# Patient Record
Sex: Male | Born: 1950 | Race: White | Hispanic: No | Marital: Married | Smoking: Former smoker
Health system: Southern US, Community
[De-identification: ages and names within clinical notes are randomized; demographics above are authoritative.]

## PROBLEM LIST (undated history)

## (undated) DIAGNOSIS — E119 Type 2 diabetes mellitus without complications: Secondary | ICD-10-CM

## (undated) DIAGNOSIS — I2510 Atherosclerotic heart disease of native coronary artery without angina pectoris: Secondary | ICD-10-CM

## (undated) DIAGNOSIS — R001 Bradycardia, unspecified: Secondary | ICD-10-CM

## (undated) DIAGNOSIS — I219 Acute myocardial infarction, unspecified: Secondary | ICD-10-CM

## (undated) HISTORY — PX: COLONOSCOPY: SHX5424

## (undated) HISTORY — PX: CARDIAC SURGERY: SHX584

---
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
## 1998-10-02 ENCOUNTER — Emergency Department (HOSPITAL_COMMUNITY): Admission: EM | Admit: 1998-10-02 | Discharge: 1998-10-02 | Payer: Self-pay | Admitting: Emergency Medicine

## 1998-10-15 ENCOUNTER — Encounter: Admission: RE | Admit: 1998-10-15 | Discharge: 1998-10-15 | Payer: Self-pay | Admitting: *Deleted

## 2015-12-12 DIAGNOSIS — R079 Chest pain, unspecified: Secondary | ICD-10-CM | POA: Insufficient documentation

## 2015-12-12 DIAGNOSIS — E119 Type 2 diabetes mellitus without complications: Secondary | ICD-10-CM

## 2015-12-12 DIAGNOSIS — Z720 Tobacco use: Secondary | ICD-10-CM | POA: Insufficient documentation

## 2015-12-12 DIAGNOSIS — I252 Old myocardial infarction: Secondary | ICD-10-CM

## 2015-12-12 DIAGNOSIS — Z789 Other specified health status: Secondary | ICD-10-CM

## 2015-12-12 HISTORY — DX: Tobacco use: Z72.0

## 2015-12-12 HISTORY — DX: Chest pain, unspecified: R07.9

## 2015-12-12 HISTORY — DX: Old myocardial infarction: I25.2

## 2017-03-08 DIAGNOSIS — I2510 Atherosclerotic heart disease of native coronary artery without angina pectoris: Secondary | ICD-10-CM | POA: Diagnosis not present

## 2017-03-08 DIAGNOSIS — J069 Acute upper respiratory infection, unspecified: Secondary | ICD-10-CM | POA: Diagnosis not present

## 2017-03-08 DIAGNOSIS — E1165 Type 2 diabetes mellitus with hyperglycemia: Secondary | ICD-10-CM | POA: Diagnosis not present

## 2017-03-08 DIAGNOSIS — R05 Cough: Secondary | ICD-10-CM | POA: Diagnosis not present

## 2017-03-08 DIAGNOSIS — D511 Vitamin B12 deficiency anemia due to selective vitamin B12 malabsorption with proteinuria: Secondary | ICD-10-CM | POA: Diagnosis not present

## 2017-03-14 DIAGNOSIS — E113512 Type 2 diabetes mellitus with proliferative diabetic retinopathy with macular edema, left eye: Secondary | ICD-10-CM | POA: Diagnosis not present

## 2017-03-14 DIAGNOSIS — H35372 Puckering of macula, left eye: Secondary | ICD-10-CM | POA: Diagnosis not present

## 2017-03-14 DIAGNOSIS — H43822 Vitreomacular adhesion, left eye: Secondary | ICD-10-CM | POA: Diagnosis not present

## 2017-03-14 DIAGNOSIS — E113591 Type 2 diabetes mellitus with proliferative diabetic retinopathy without macular edema, right eye: Secondary | ICD-10-CM | POA: Diagnosis not present

## 2017-03-19 DIAGNOSIS — R140 Abdominal distension (gaseous): Secondary | ICD-10-CM | POA: Diagnosis not present

## 2017-03-19 DIAGNOSIS — I440 Atrioventricular block, first degree: Secondary | ICD-10-CM | POA: Diagnosis not present

## 2017-03-19 DIAGNOSIS — E785 Hyperlipidemia, unspecified: Secondary | ICD-10-CM | POA: Diagnosis not present

## 2017-03-19 DIAGNOSIS — E119 Type 2 diabetes mellitus without complications: Secondary | ICD-10-CM | POA: Diagnosis not present

## 2017-03-19 DIAGNOSIS — F17220 Nicotine dependence, chewing tobacco, uncomplicated: Secondary | ICD-10-CM | POA: Diagnosis not present

## 2017-03-19 DIAGNOSIS — I252 Old myocardial infarction: Secondary | ICD-10-CM | POA: Diagnosis not present

## 2017-03-19 DIAGNOSIS — K5900 Constipation, unspecified: Secondary | ICD-10-CM | POA: Diagnosis not present

## 2017-03-19 DIAGNOSIS — R109 Unspecified abdominal pain: Secondary | ICD-10-CM | POA: Diagnosis not present

## 2017-03-19 DIAGNOSIS — R1010 Upper abdominal pain, unspecified: Secondary | ICD-10-CM | POA: Diagnosis not present

## 2017-04-18 DIAGNOSIS — D511 Vitamin B12 deficiency anemia due to selective vitamin B12 malabsorption with proteinuria: Secondary | ICD-10-CM | POA: Diagnosis not present

## 2017-04-18 DIAGNOSIS — I2510 Atherosclerotic heart disease of native coronary artery without angina pectoris: Secondary | ICD-10-CM | POA: Diagnosis not present

## 2017-04-18 DIAGNOSIS — E1165 Type 2 diabetes mellitus with hyperglycemia: Secondary | ICD-10-CM | POA: Diagnosis not present

## 2017-04-18 DIAGNOSIS — E782 Mixed hyperlipidemia: Secondary | ICD-10-CM | POA: Diagnosis not present

## 2017-05-21 DIAGNOSIS — H35373 Puckering of macula, bilateral: Secondary | ICD-10-CM | POA: Diagnosis not present

## 2017-05-21 DIAGNOSIS — H43822 Vitreomacular adhesion, left eye: Secondary | ICD-10-CM | POA: Diagnosis not present

## 2017-05-21 DIAGNOSIS — H3582 Retinal ischemia: Secondary | ICD-10-CM | POA: Diagnosis not present

## 2017-05-21 DIAGNOSIS — E113513 Type 2 diabetes mellitus with proliferative diabetic retinopathy with macular edema, bilateral: Secondary | ICD-10-CM | POA: Diagnosis not present

## 2017-06-15 DIAGNOSIS — E1165 Type 2 diabetes mellitus with hyperglycemia: Secondary | ICD-10-CM | POA: Diagnosis not present

## 2017-06-15 DIAGNOSIS — E559 Vitamin D deficiency, unspecified: Secondary | ICD-10-CM | POA: Diagnosis not present

## 2017-06-15 DIAGNOSIS — E291 Testicular hypofunction: Secondary | ICD-10-CM | POA: Diagnosis not present

## 2017-06-15 DIAGNOSIS — I10 Essential (primary) hypertension: Secondary | ICD-10-CM | POA: Diagnosis not present

## 2017-06-15 DIAGNOSIS — D518 Other vitamin B12 deficiency anemias: Secondary | ICD-10-CM | POA: Diagnosis not present

## 2017-06-15 DIAGNOSIS — J309 Allergic rhinitis, unspecified: Secondary | ICD-10-CM | POA: Diagnosis not present

## 2017-06-15 DIAGNOSIS — E119 Type 2 diabetes mellitus without complications: Secondary | ICD-10-CM | POA: Diagnosis not present

## 2017-06-15 DIAGNOSIS — E785 Hyperlipidemia, unspecified: Secondary | ICD-10-CM | POA: Diagnosis not present

## 2017-06-15 DIAGNOSIS — E782 Mixed hyperlipidemia: Secondary | ICD-10-CM | POA: Diagnosis not present

## 2017-06-15 DIAGNOSIS — E038 Other specified hypothyroidism: Secondary | ICD-10-CM | POA: Diagnosis not present

## 2017-06-24 DIAGNOSIS — Z87891 Personal history of nicotine dependence: Secondary | ICD-10-CM | POA: Diagnosis not present

## 2017-06-24 DIAGNOSIS — Z794 Long term (current) use of insulin: Secondary | ICD-10-CM | POA: Diagnosis not present

## 2017-06-24 DIAGNOSIS — R42 Dizziness and giddiness: Secondary | ICD-10-CM | POA: Diagnosis not present

## 2017-06-24 DIAGNOSIS — I252 Old myocardial infarction: Secondary | ICD-10-CM | POA: Diagnosis not present

## 2017-06-24 DIAGNOSIS — R55 Syncope and collapse: Secondary | ICD-10-CM | POA: Diagnosis not present

## 2017-06-24 DIAGNOSIS — R202 Paresthesia of skin: Secondary | ICD-10-CM | POA: Diagnosis not present

## 2017-06-24 DIAGNOSIS — R208 Other disturbances of skin sensation: Secondary | ICD-10-CM | POA: Diagnosis not present

## 2017-06-24 DIAGNOSIS — I10 Essential (primary) hypertension: Secondary | ICD-10-CM | POA: Diagnosis not present

## 2017-06-24 DIAGNOSIS — Z955 Presence of coronary angioplasty implant and graft: Secondary | ICD-10-CM | POA: Diagnosis not present

## 2017-06-24 DIAGNOSIS — Z7902 Long term (current) use of antithrombotics/antiplatelets: Secondary | ICD-10-CM | POA: Diagnosis not present

## 2017-06-24 DIAGNOSIS — R05 Cough: Secondary | ICD-10-CM | POA: Diagnosis not present

## 2017-06-24 DIAGNOSIS — E785 Hyperlipidemia, unspecified: Secondary | ICD-10-CM | POA: Diagnosis not present

## 2017-06-24 DIAGNOSIS — R232 Flushing: Secondary | ICD-10-CM | POA: Diagnosis not present

## 2017-06-24 DIAGNOSIS — E119 Type 2 diabetes mellitus without complications: Secondary | ICD-10-CM | POA: Diagnosis not present

## 2017-06-26 DIAGNOSIS — J018 Other acute sinusitis: Secondary | ICD-10-CM | POA: Diagnosis not present

## 2017-06-26 DIAGNOSIS — R05 Cough: Secondary | ICD-10-CM | POA: Diagnosis not present

## 2017-06-26 DIAGNOSIS — E1165 Type 2 diabetes mellitus with hyperglycemia: Secondary | ICD-10-CM | POA: Diagnosis not present

## 2017-06-28 DIAGNOSIS — E119 Type 2 diabetes mellitus without complications: Secondary | ICD-10-CM | POA: Diagnosis not present

## 2017-06-28 DIAGNOSIS — Z7982 Long term (current) use of aspirin: Secondary | ICD-10-CM | POA: Diagnosis not present

## 2017-06-28 DIAGNOSIS — E785 Hyperlipidemia, unspecified: Secondary | ICD-10-CM | POA: Diagnosis not present

## 2017-06-28 DIAGNOSIS — I252 Old myocardial infarction: Secondary | ICD-10-CM | POA: Diagnosis not present

## 2017-06-28 DIAGNOSIS — Z7902 Long term (current) use of antithrombotics/antiplatelets: Secondary | ICD-10-CM | POA: Diagnosis not present

## 2017-06-28 DIAGNOSIS — R109 Unspecified abdominal pain: Secondary | ICD-10-CM | POA: Diagnosis not present

## 2017-06-28 DIAGNOSIS — N281 Cyst of kidney, acquired: Secondary | ICD-10-CM | POA: Diagnosis not present

## 2017-06-28 DIAGNOSIS — R1013 Epigastric pain: Secondary | ICD-10-CM | POA: Diagnosis not present

## 2017-06-28 DIAGNOSIS — Z794 Long term (current) use of insulin: Secondary | ICD-10-CM | POA: Diagnosis not present

## 2017-06-29 DIAGNOSIS — E785 Hyperlipidemia, unspecified: Secondary | ICD-10-CM | POA: Diagnosis not present

## 2017-06-29 DIAGNOSIS — E559 Vitamin D deficiency, unspecified: Secondary | ICD-10-CM | POA: Diagnosis not present

## 2017-06-29 DIAGNOSIS — E1165 Type 2 diabetes mellitus with hyperglycemia: Secondary | ICD-10-CM | POA: Diagnosis not present

## 2017-06-29 DIAGNOSIS — E291 Testicular hypofunction: Secondary | ICD-10-CM | POA: Diagnosis not present

## 2017-07-27 DIAGNOSIS — R0602 Shortness of breath: Secondary | ICD-10-CM | POA: Diagnosis not present

## 2017-07-27 DIAGNOSIS — F419 Anxiety disorder, unspecified: Secondary | ICD-10-CM | POA: Diagnosis not present

## 2017-07-27 DIAGNOSIS — Z794 Long term (current) use of insulin: Secondary | ICD-10-CM | POA: Diagnosis not present

## 2017-07-27 DIAGNOSIS — E1165 Type 2 diabetes mellitus with hyperglycemia: Secondary | ICD-10-CM | POA: Diagnosis not present

## 2017-07-27 DIAGNOSIS — F17220 Nicotine dependence, chewing tobacco, uncomplicated: Secondary | ICD-10-CM | POA: Diagnosis not present

## 2017-07-27 DIAGNOSIS — Z7982 Long term (current) use of aspirin: Secondary | ICD-10-CM | POA: Diagnosis not present

## 2017-07-27 DIAGNOSIS — D511 Vitamin B12 deficiency anemia due to selective vitamin B12 malabsorption with proteinuria: Secondary | ICD-10-CM | POA: Diagnosis not present

## 2017-07-27 DIAGNOSIS — R110 Nausea: Secondary | ICD-10-CM | POA: Diagnosis not present

## 2017-07-27 DIAGNOSIS — Z955 Presence of coronary angioplasty implant and graft: Secondary | ICD-10-CM | POA: Diagnosis not present

## 2017-07-27 DIAGNOSIS — E119 Type 2 diabetes mellitus without complications: Secondary | ICD-10-CM | POA: Diagnosis not present

## 2017-07-27 DIAGNOSIS — F410 Panic disorder [episodic paroxysmal anxiety] without agoraphobia: Secondary | ICD-10-CM | POA: Diagnosis not present

## 2017-07-27 DIAGNOSIS — J069 Acute upper respiratory infection, unspecified: Secondary | ICD-10-CM | POA: Diagnosis not present

## 2017-07-27 DIAGNOSIS — Z7902 Long term (current) use of antithrombotics/antiplatelets: Secondary | ICD-10-CM | POA: Diagnosis not present

## 2017-07-27 DIAGNOSIS — J018 Other acute sinusitis: Secondary | ICD-10-CM | POA: Diagnosis not present

## 2017-07-27 DIAGNOSIS — I2510 Atherosclerotic heart disease of native coronary artery without angina pectoris: Secondary | ICD-10-CM | POA: Diagnosis not present

## 2017-07-27 DIAGNOSIS — R531 Weakness: Secondary | ICD-10-CM | POA: Diagnosis not present

## 2017-07-27 DIAGNOSIS — I252 Old myocardial infarction: Secondary | ICD-10-CM | POA: Diagnosis not present

## 2017-07-27 DIAGNOSIS — R0789 Other chest pain: Secondary | ICD-10-CM | POA: Diagnosis not present

## 2017-07-27 DIAGNOSIS — R079 Chest pain, unspecified: Secondary | ICD-10-CM | POA: Diagnosis not present

## 2017-07-27 DIAGNOSIS — E785 Hyperlipidemia, unspecified: Secondary | ICD-10-CM | POA: Diagnosis not present

## 2017-07-27 DIAGNOSIS — D518 Other vitamin B12 deficiency anemias: Secondary | ICD-10-CM | POA: Diagnosis not present

## 2017-07-28 DIAGNOSIS — F17220 Nicotine dependence, chewing tobacco, uncomplicated: Secondary | ICD-10-CM | POA: Diagnosis not present

## 2017-07-28 DIAGNOSIS — E119 Type 2 diabetes mellitus without complications: Secondary | ICD-10-CM | POA: Diagnosis not present

## 2017-07-28 DIAGNOSIS — I252 Old myocardial infarction: Secondary | ICD-10-CM | POA: Diagnosis not present

## 2017-07-28 DIAGNOSIS — R079 Chest pain, unspecified: Secondary | ICD-10-CM | POA: Diagnosis not present

## 2017-07-29 ENCOUNTER — Encounter (HOSPITAL_COMMUNITY): Payer: Self-pay | Admitting: Emergency Medicine

## 2017-07-29 ENCOUNTER — Emergency Department (HOSPITAL_COMMUNITY): Payer: PPO

## 2017-07-29 ENCOUNTER — Other Ambulatory Visit: Payer: Self-pay

## 2017-07-29 ENCOUNTER — Emergency Department (HOSPITAL_COMMUNITY)
Admission: EM | Admit: 2017-07-29 | Discharge: 2017-07-29 | Disposition: A | Discharge status: Home / Self Care | Payer: PPO | Attending: Physician Assistant | Admitting: Physician Assistant

## 2017-07-29 DIAGNOSIS — Z87891 Personal history of nicotine dependence: Secondary | ICD-10-CM | POA: Diagnosis not present

## 2017-07-29 DIAGNOSIS — Z7902 Long term (current) use of antithrombotics/antiplatelets: Secondary | ICD-10-CM | POA: Insufficient documentation

## 2017-07-29 DIAGNOSIS — Z7982 Long term (current) use of aspirin: Secondary | ICD-10-CM | POA: Diagnosis not present

## 2017-07-29 DIAGNOSIS — R0789 Other chest pain: Secondary | ICD-10-CM | POA: Insufficient documentation

## 2017-07-29 DIAGNOSIS — I2510 Atherosclerotic heart disease of native coronary artery without angina pectoris: Secondary | ICD-10-CM | POA: Insufficient documentation

## 2017-07-29 DIAGNOSIS — Z794 Long term (current) use of insulin: Secondary | ICD-10-CM | POA: Insufficient documentation

## 2017-07-29 DIAGNOSIS — I252 Old myocardial infarction: Secondary | ICD-10-CM | POA: Insufficient documentation

## 2017-07-29 DIAGNOSIS — R9431 Abnormal electrocardiogram [ECG] [EKG]: Secondary | ICD-10-CM | POA: Diagnosis not present

## 2017-07-29 DIAGNOSIS — R079 Chest pain, unspecified: Secondary | ICD-10-CM | POA: Diagnosis not present

## 2017-07-29 DIAGNOSIS — Z79899 Other long term (current) drug therapy: Secondary | ICD-10-CM | POA: Insufficient documentation

## 2017-07-29 HISTORY — DX: Atherosclerotic heart disease of native coronary artery without angina pectoris: I25.10

## 2017-07-29 HISTORY — DX: Acute myocardial infarction, unspecified: I21.9

## 2017-07-29 LAB — BASIC METABOLIC PANEL
ANION GAP: 9 (ref 5–15)
BUN: 12 mg/dL (ref 6–20)
CALCIUM: 9 mg/dL (ref 8.9–10.3)
CO2: 23 mmol/L (ref 22–32)
Chloride: 103 mmol/L (ref 101–111)
Creatinine, Ser: 0.94 mg/dL (ref 0.61–1.24)
GFR calc Af Amer: >60 mL/min (ref >60)
GFR calc non Af Amer: >60 mL/min (ref >60)
GLUCOSE: 241 mg/dL — AB (ref 65–99)
POTASSIUM: 4.1 mmol/L (ref 3.5–5.1)
Sodium: 135 mmol/L (ref 135–145)

## 2017-07-29 LAB — I-STAT TROPONIN, ED
Troponin i, poc: 0 ng/mL (ref 0.00–0.08)
Troponin i, poc: 0.01 ng/mL (ref 0.00–0.08)

## 2017-07-29 LAB — CBC
HEMATOCRIT: 44.2 % (ref 39.0–52.0)
HEMOGLOBIN: 14.6 g/dL (ref 13.0–17.0)
MCH: 30.5 pg (ref 26.0–34.0)
MCHC: 33 g/dL (ref 30.0–36.0)
MCV: 92.3 fL (ref 78.0–100.0)
Platelets: 251 10*3/uL (ref 150–400)
RBC: 4.79 MIL/uL (ref 4.22–5.81)
RDW: 12.5 % (ref 11.5–15.5)
WBC: 8 10*3/uL (ref 4.0–10.5)

## 2017-07-29 MED ORDER — ATORVASTATIN CALCIUM 40 MG PO TABS
40.00 | ORAL_TABLET | ORAL | Status: DC
Start: 2017-07-29 — End: 2017-07-29

## 2017-07-29 MED ORDER — INSULIN LISPRO 100 UNIT/ML ~~LOC~~ SOLN
0.00 | SUBCUTANEOUS | Status: DC
Start: 2017-07-28 — End: 2017-07-29

## 2017-07-29 MED ORDER — INSULIN GLARGINE 100 UNIT/ML SOLOSTAR PEN
36.00 | PEN_INJECTOR | SUBCUTANEOUS | Status: DC
Start: 2017-07-28 — End: 2017-07-29

## 2017-07-29 MED ORDER — CLOPIDOGREL BISULFATE 75 MG PO TABS
75.00 | ORAL_TABLET | ORAL | Status: DC
Start: 2017-07-29 — End: 2017-07-29

## 2017-07-29 MED ORDER — ASPIRIN EC 81 MG PO TBEC
81.00 | DELAYED_RELEASE_TABLET | ORAL | Status: DC
Start: 2017-07-29 — End: 2017-07-29

## 2017-07-29 MED ORDER — DEXTROSE 50 % IV SOLN
12.50 | INTRAVENOUS | Status: DC
Start: ? — End: 2017-07-29

## 2017-07-29 NOTE — Discharge Instructions (Addendum)
We are unsure what is causing your symptoms.  Want you to follow-up with your primary care in the next 2 days.  Please keep a log of your symptoms and atypical feelings to record them.  In particular pay attention to any things you may have eaten prior and the time of day it happens.  Please also follow-up with your cardiology.  You may need another stress test given that has been almost a year since her last one.

## 2017-07-29 NOTE — ED Triage Notes (Signed)
Pt c/o chest pain that radiates to the left arm, diaphoresis and feeling "swimmy headed". Similar episode Friday, seen at [facility] and started on buspar 10mg , and given a heart monitor to wear. Hx MI with stents.

## 2017-07-29 NOTE — ED Provider Notes (Addendum)
[place] EMERGENCY DEPARTMENT Provider Note   CSN: [id number] Arrival date & time: 07/29/17  1601     History   Chief Complaint Chief Complaint  Patient presents with  . Chest Pain    HPI Jay Hanson is a 67 y.o. male.  HPI   67 yo sp stent 5 years ago.  Ho htn, dm, hld. Over the last month he has had 4 discrete events with SOB, diaphoresis, CP nausea.  .  Patient was discharged 2 days after hosptiziation for these "attacks'.  With normal tele monitoring and negative serial trops. 1 of these events happened during the last hospitlization. Patient was discharged on heart monitor.    Patient had another "event" today while sitting in the recliner.    patient started on Busporone after last hosptilization. (started today)   Past Medical History:  Diagnosis Date  . Coronary artery disease   . MI (myocardial infarction) ([place])     There are no active problems to display for this patient.   Past Surgical History:  Procedure Laterality Date  . CARDIAC SURGERY          Home Medications    Prior to Admission medications   Medication Sig Start Date End Date Taking? Authorizing Provider  aspirin EC 81 MG tablet Take 81 mg by mouth daily.   Yes [provider]  busPIRone (BUSPAR) 10 MG tablet Take 10 mg by mouth 2 (two) times daily. 07/28/17  Yes [provider]  clopidogrel (PLAVIX) 75 MG tablet Take 37.5 mg by mouth daily.  06/15/17  Yes [provider]  dextromethorphan-guaiFENesin (MUCINEX DM) 30-600 MG 12hr tablet Take 1 tablet by mouth 2 (two) times daily.   Yes [provider]  fluticasone (FLONASE) 50 MCG/ACT nasal spray Place 1 spray into both nostrils daily as needed for allergies. 06/26/17  Yes [provider]  insulin aspart (NOVOLOG) 100 UNIT/ML injection Inject 1-4 Units into the skin 3 (three) times daily with meals. Per SS   Yes [provider]  insulin detemir (LEVEMIR) 100 UNIT/ML  injection Inject 36 Units into the skin 2 (two) times daily.   Yes [provider]  levofloxacin (LEVAQUIN) 750 MG tablet Take 750 mg by mouth daily. 07/27/17 08/07/17 Yes [provider]  metoCLOPramide (REGLAN) 10 MG tablet Take 10 mg by mouth daily. 06/29/17  Yes [provider]  sitaGLIPtin-metformin (JANUMET) 50-1000 MG tablet Take 1 tablet by mouth 2 (two) times daily.   Yes [provider]    Family History No family history on file.  Social History Social History   Tobacco Use  . Smoking status: Former Research scientist (life sciences)  . Smokeless tobacco: Never Used  Substance Use Topics  . Alcohol use: Never    Frequency: Never  . Drug use: Never     Allergies   Patient has no known allergies.   Review of Systems Review of Systems  Constitutional: Negative for activity change, fatigue and fever.  Respiratory: Negative for shortness of breath.   Cardiovascular: Positive for chest pain.  Gastrointestinal: Negative for abdominal pain.     Physical Exam Updated Vital Signs BP (!) 146/82   Pulse 64   Temp 98.3 F (36.8 C) (Oral)   Resp 20   SpO2 100%   Physical Exam  Constitutional: He is oriented to person, place, and time. He appears well-nourished.  HENT:  Head: Normocephalic.  Eyes: Pupils are equal, round, and reactive to light. Conjunctivae and EOM are normal.  Neck: Normal range of motion.  Cardiovascular: Normal rate, regular rhythm, intact distal pulses and normal pulses.  Pulmonary/Chest: Effort normal and breath sounds normal. No accessory muscle usage. No respiratory distress.  Neurological: He is oriented to person, place, and time.  Skin: Skin is warm and dry. He is not diaphoretic.  Psychiatric: He has a normal mood and affect. His behavior is normal.  Nursing note and vitals reviewed.    ED Treatments / Results  Labs (all labs ordered are listed, but only abnormal results are displayed) Labs Reviewed  BASIC METABOLIC PANEL -  Abnormal; Notable for the following components:      Result Value   Glucose, Bld 241 (*)    All other components within normal limits  CBC  I-STAT TROPONIN, ED    EKG EKG Interpretation  Date/Time:  Sunday Jul 29 2017 16:06:27 EDT Ventricular Rate:  79 PR Interval:  172 QRS Duration: 94 QT Interval:  380 QTC Calculation: 435 R Axis:   0 Text Interpretation:  Normal sinus rhythm Inferior infarct , age undetermined Abnormal ECG Normal sinus rhythm Confirmed by Levander Katzenstein ([id number]) on 07/29/2017 7:50:08 PM   Radiology Dg Chest 2 View  Result Date: 07/29/2017 CLINICAL DATA:  Chest pain EXAM: CHEST - 2 VIEW COMPARISON:  Jul 27, 2017 and June 11, 2016 FINDINGS: There is blunting of the left costophrenic angle, a chronic finding consistent with mild scarring. No edema or consolidation. Heart size and pulmonary vascularity are normal. No adenopathy. There is a cardiac monitor device overlying the anterior left hemithorax. There is degenerative change in the thoracic spine. IMPRESSION: Mild scarring lateral left base. No edema or consolidation. Heart size normal. No adenopathy evident. Electronically Signed   By: William  Woodruff III M.D.   On: 07/29/2017 17:05    Procedures Procedures (including critical care time)  Medications Ordered in ED Medications - No data to display   Initial Impression / Assessment and Plan / ED Course  I have reviewed the triage vital signs and the nursing notes.  Pertinent labs & imaging results that were available during my care of the patient were reviewed by me and considered in my medical decision making (see chart for details).     66  yo sp stent 5 years ago.  Over the last month he has had 4 discrete events with SOB, diaphoresis, CP nausea.  .  Patient was discharged 2 days after hosptiziation for these "attacks'.  With normal tele monitoring and negative serial trops. 1 of these events happened during the last hospitlization and nothing noted  on tele.  Patient was discharged on heart monitor.    patient started on Busporone after last hosptilization. (started today)   Patient had another "event" today while sitting in the recliner.    Of note patient has had normal stress test within the last year by his cards as well as carotids and echo that were normal.   9:08 PM According to care everywhere it looks like besides patient's recent hospitalization, discharged 24 hours ago.  He was also seen for abdominal pain on 25 April, chest pain on 21 April.  It appears he has had roughly 6 ER visits in the last 2 years for chest pain.  9:39 PM Patient's event today lasted less than 30 seconds.  Associated with heat, warmth feeling of slight shortness of breath and a twinge of chest pain.  It is really unclear what these events are.  They last less than 30 seconds, are not  typical for ACS.  Are not also typical for anxiety.  We will have him keep a close journal of the symptoms and when they occur.  We will have him bring it to his primary care  for follow-up.  Additionally patient aolready has a Holter monitor on.  Will encourage him to follow-up with his cardiology group and [place].    No risk factors for PE. No SOB. No tachycardia no hypoxia. No radiation to back. Discussed esophageal spasm.  There is is not much to be done by additional labs and admission at this time.  Patient is now status post 9 hours from this event.  We will do troponin at this time.  If negative I will idischarge patient home and have him return with any concerning symptoms.  Final Clinical Impressions(s) / ED Diagnoses   Final diagnoses:  None    ED Discharge Orders    None       Macarthur Critchley, MD 07/29/17 2149    Macarthur Critchley, MD 07/29/17 2217

## 2017-08-01 ENCOUNTER — Other Ambulatory Visit: Payer: Self-pay

## 2017-08-01 DIAGNOSIS — E291 Testicular hypofunction: Secondary | ICD-10-CM | POA: Diagnosis not present

## 2017-08-01 DIAGNOSIS — R300 Dysuria: Secondary | ICD-10-CM | POA: Diagnosis not present

## 2017-08-01 DIAGNOSIS — E559 Vitamin D deficiency, unspecified: Secondary | ICD-10-CM | POA: Diagnosis not present

## 2017-08-01 DIAGNOSIS — E785 Hyperlipidemia, unspecified: Secondary | ICD-10-CM | POA: Diagnosis not present

## 2017-08-01 DIAGNOSIS — I2510 Atherosclerotic heart disease of native coronary artery without angina pectoris: Secondary | ICD-10-CM | POA: Diagnosis not present

## 2017-08-01 DIAGNOSIS — R079 Chest pain, unspecified: Secondary | ICD-10-CM | POA: Diagnosis not present

## 2017-08-01 DIAGNOSIS — E1165 Type 2 diabetes mellitus with hyperglycemia: Secondary | ICD-10-CM | POA: Diagnosis not present

## 2017-08-01 NOTE — Patient Outreach (Signed)
[facility]) Care Management  08/01/2017  Jay Hanson Sep 24, 1950 [id number]   Referral Date: 08/01/17 Referral Source: HTA report Date of Admission: unknown Diagnosis: unknown Date of Discharge: 07/28/17 Facility:  [facility] attempt # 1 Telephone call to patient for transition of care. No answer.  Unable to leave a message.   Plan: RN CM will send letter and attempt again within 4 business days.     Jone Baseman, RN, MSN [facility] Care Management Care Management Coordinator Direct Line [phone] Toll Free: [phone]  Fax: [phone]

## 2017-08-02 ENCOUNTER — Other Ambulatory Visit: Payer: Self-pay

## 2017-08-02 NOTE — Patient Outreach (Signed)
[facility]) Care Management  08/02/2017  Jay Hanson April 24, 1950 [id number]   Referral Date: 08/01/17 Referral Source: HTA report Date of Admission: 07/27/17 Diagnosis: Chest pain Date of Discharge: 07/28/17 Facility: [place]: HTA  Outreach attempt #2 spoke with patient he is able to verify HIPAA.  Patient reports he is doing ok but just waiting on test results.  He states that he went to the hospital for chest pain and his heart things checked out normal.  He states that he was there less than 24 hours.  Asked patient about visit to [facility] ER.  He states he went for a second option and they found nothing with his heart as well.  Patient states that he saw his primary care physician on yesterday and lots of test were done including an echocardiogram.  He states they are thinking it is his hormones and waiting on results. He states he has episodes when he feels weak and nervous.  Patient reports that he has his medication and is able to afford medications.  He offers no concerns.    Discussed [facility] care management services with patient. He declines services but appreciative of the call.    Plan: RN CM will send letter and close case.     Jone Baseman, RN, MSN [facility] Care Management Care Management Coordinator Direct Line [phone] Toll Free: [phone]  Fax: [phone]

## 2017-08-08 DIAGNOSIS — E1165 Type 2 diabetes mellitus with hyperglycemia: Secondary | ICD-10-CM | POA: Diagnosis not present

## 2017-08-08 DIAGNOSIS — E559 Vitamin D deficiency, unspecified: Secondary | ICD-10-CM | POA: Diagnosis not present

## 2017-08-08 DIAGNOSIS — E785 Hyperlipidemia, unspecified: Secondary | ICD-10-CM | POA: Diagnosis not present

## 2017-08-08 DIAGNOSIS — E291 Testicular hypofunction: Secondary | ICD-10-CM | POA: Diagnosis not present

## 2017-08-09 DIAGNOSIS — E291 Testicular hypofunction: Secondary | ICD-10-CM | POA: Diagnosis not present

## 2017-08-13 DIAGNOSIS — H3582 Retinal ischemia: Secondary | ICD-10-CM | POA: Diagnosis not present

## 2017-08-13 DIAGNOSIS — H35373 Puckering of macula, bilateral: Secondary | ICD-10-CM | POA: Diagnosis not present

## 2017-08-13 DIAGNOSIS — H43822 Vitreomacular adhesion, left eye: Secondary | ICD-10-CM | POA: Diagnosis not present

## 2017-08-13 DIAGNOSIS — E113513 Type 2 diabetes mellitus with proliferative diabetic retinopathy with macular edema, bilateral: Secondary | ICD-10-CM | POA: Diagnosis not present

## 2017-08-23 DIAGNOSIS — E291 Testicular hypofunction: Secondary | ICD-10-CM | POA: Diagnosis not present

## 2017-09-04 DIAGNOSIS — R079 Chest pain, unspecified: Secondary | ICD-10-CM | POA: Diagnosis not present

## 2017-09-14 DIAGNOSIS — E559 Vitamin D deficiency, unspecified: Secondary | ICD-10-CM | POA: Diagnosis not present

## 2017-09-14 DIAGNOSIS — I2510 Atherosclerotic heart disease of native coronary artery without angina pectoris: Secondary | ICD-10-CM | POA: Diagnosis not present

## 2017-09-14 DIAGNOSIS — E119 Type 2 diabetes mellitus without complications: Secondary | ICD-10-CM | POA: Diagnosis not present

## 2017-09-14 DIAGNOSIS — E785 Hyperlipidemia, unspecified: Secondary | ICD-10-CM | POA: Diagnosis not present

## 2017-09-14 DIAGNOSIS — E291 Testicular hypofunction: Secondary | ICD-10-CM | POA: Diagnosis not present

## 2017-09-14 DIAGNOSIS — E1165 Type 2 diabetes mellitus with hyperglycemia: Secondary | ICD-10-CM | POA: Diagnosis not present

## 2017-09-24 DIAGNOSIS — E113512 Type 2 diabetes mellitus with proliferative diabetic retinopathy with macular edema, left eye: Secondary | ICD-10-CM | POA: Diagnosis not present

## 2017-11-15 DIAGNOSIS — H35373 Puckering of macula, bilateral: Secondary | ICD-10-CM | POA: Diagnosis not present

## 2017-11-15 DIAGNOSIS — E113513 Type 2 diabetes mellitus with proliferative diabetic retinopathy with macular edema, bilateral: Secondary | ICD-10-CM | POA: Diagnosis not present

## 2017-11-15 DIAGNOSIS — H3582 Retinal ischemia: Secondary | ICD-10-CM | POA: Diagnosis not present

## 2017-11-15 DIAGNOSIS — H43822 Vitreomacular adhesion, left eye: Secondary | ICD-10-CM | POA: Diagnosis not present

## 2017-12-06 DIAGNOSIS — E785 Hyperlipidemia, unspecified: Secondary | ICD-10-CM | POA: Diagnosis not present

## 2017-12-06 DIAGNOSIS — I252 Old myocardial infarction: Secondary | ICD-10-CM | POA: Diagnosis not present

## 2017-12-06 DIAGNOSIS — Z7982 Long term (current) use of aspirin: Secondary | ICD-10-CM | POA: Diagnosis not present

## 2017-12-06 DIAGNOSIS — R61 Generalized hyperhidrosis: Secondary | ICD-10-CM | POA: Diagnosis not present

## 2017-12-06 DIAGNOSIS — Z7902 Long term (current) use of antithrombotics/antiplatelets: Secondary | ICD-10-CM | POA: Diagnosis not present

## 2017-12-06 DIAGNOSIS — R079 Chest pain, unspecified: Secondary | ICD-10-CM | POA: Diagnosis not present

## 2017-12-06 DIAGNOSIS — Z955 Presence of coronary angioplasty implant and graft: Secondary | ICD-10-CM | POA: Diagnosis not present

## 2017-12-06 DIAGNOSIS — R682 Dry mouth, unspecified: Secondary | ICD-10-CM | POA: Diagnosis not present

## 2017-12-06 DIAGNOSIS — Z79899 Other long term (current) drug therapy: Secondary | ICD-10-CM | POA: Diagnosis not present

## 2017-12-06 DIAGNOSIS — R0982 Postnasal drip: Secondary | ICD-10-CM | POA: Diagnosis not present

## 2017-12-06 DIAGNOSIS — F17220 Nicotine dependence, chewing tobacco, uncomplicated: Secondary | ICD-10-CM | POA: Diagnosis not present

## 2017-12-06 DIAGNOSIS — E119 Type 2 diabetes mellitus without complications: Secondary | ICD-10-CM | POA: Diagnosis not present

## 2017-12-06 DIAGNOSIS — I2510 Atherosclerotic heart disease of native coronary artery without angina pectoris: Secondary | ICD-10-CM | POA: Diagnosis not present

## 2017-12-06 DIAGNOSIS — R0602 Shortness of breath: Secondary | ICD-10-CM | POA: Diagnosis not present

## 2017-12-06 DIAGNOSIS — K219 Gastro-esophageal reflux disease without esophagitis: Secondary | ICD-10-CM | POA: Diagnosis not present

## 2017-12-06 DIAGNOSIS — Z794 Long term (current) use of insulin: Secondary | ICD-10-CM | POA: Diagnosis not present

## 2017-12-07 DIAGNOSIS — K219 Gastro-esophageal reflux disease without esophagitis: Secondary | ICD-10-CM | POA: Diagnosis not present

## 2018-01-17 DIAGNOSIS — E113512 Type 2 diabetes mellitus with proliferative diabetic retinopathy with macular edema, left eye: Secondary | ICD-10-CM | POA: Diagnosis not present

## 2018-02-07 DIAGNOSIS — S39012A Strain of muscle, fascia and tendon of lower back, initial encounter: Secondary | ICD-10-CM | POA: Diagnosis not present

## 2018-02-07 DIAGNOSIS — E1165 Type 2 diabetes mellitus with hyperglycemia: Secondary | ICD-10-CM | POA: Diagnosis not present

## 2018-02-21 DIAGNOSIS — E113513 Type 2 diabetes mellitus with proliferative diabetic retinopathy with macular edema, bilateral: Secondary | ICD-10-CM | POA: Diagnosis not present

## 2018-02-21 DIAGNOSIS — H43822 Vitreomacular adhesion, left eye: Secondary | ICD-10-CM | POA: Diagnosis not present

## 2018-02-21 DIAGNOSIS — H3582 Retinal ischemia: Secondary | ICD-10-CM | POA: Diagnosis not present

## 2018-02-21 DIAGNOSIS — H35373 Puckering of macula, bilateral: Secondary | ICD-10-CM | POA: Diagnosis not present

## 2018-04-04 DIAGNOSIS — E291 Testicular hypofunction: Secondary | ICD-10-CM | POA: Diagnosis not present

## 2018-04-04 DIAGNOSIS — E559 Vitamin D deficiency, unspecified: Secondary | ICD-10-CM | POA: Diagnosis not present

## 2018-04-04 DIAGNOSIS — E785 Hyperlipidemia, unspecified: Secondary | ICD-10-CM | POA: Diagnosis not present

## 2018-04-04 DIAGNOSIS — E1165 Type 2 diabetes mellitus with hyperglycemia: Secondary | ICD-10-CM | POA: Diagnosis not present

## 2018-04-04 DIAGNOSIS — E038 Other specified hypothyroidism: Secondary | ICD-10-CM | POA: Diagnosis not present

## 2018-04-04 DIAGNOSIS — E782 Mixed hyperlipidemia: Secondary | ICD-10-CM | POA: Diagnosis not present

## 2018-04-04 DIAGNOSIS — E119 Type 2 diabetes mellitus without complications: Secondary | ICD-10-CM | POA: Diagnosis not present

## 2018-04-04 DIAGNOSIS — D518 Other vitamin B12 deficiency anemias: Secondary | ICD-10-CM | POA: Diagnosis not present

## 2018-04-04 DIAGNOSIS — I10 Essential (primary) hypertension: Secondary | ICD-10-CM | POA: Diagnosis not present

## 2018-04-09 DIAGNOSIS — E113513 Type 2 diabetes mellitus with proliferative diabetic retinopathy with macular edema, bilateral: Secondary | ICD-10-CM | POA: Diagnosis not present

## 2018-04-09 DIAGNOSIS — E113512 Type 2 diabetes mellitus with proliferative diabetic retinopathy with macular edema, left eye: Secondary | ICD-10-CM | POA: Diagnosis not present

## 2018-06-10 DIAGNOSIS — R0981 Nasal congestion: Secondary | ICD-10-CM | POA: Diagnosis not present

## 2018-06-10 DIAGNOSIS — I2510 Atherosclerotic heart disease of native coronary artery without angina pectoris: Secondary | ICD-10-CM | POA: Diagnosis not present

## 2018-06-10 DIAGNOSIS — Z87891 Personal history of nicotine dependence: Secondary | ICD-10-CM | POA: Diagnosis not present

## 2018-06-10 DIAGNOSIS — E119 Type 2 diabetes mellitus without complications: Secondary | ICD-10-CM | POA: Diagnosis not present

## 2018-06-10 DIAGNOSIS — I252 Old myocardial infarction: Secondary | ICD-10-CM | POA: Diagnosis not present

## 2018-06-10 DIAGNOSIS — C3490 Malignant neoplasm of unspecified part of unspecified bronchus or lung: Secondary | ICD-10-CM | POA: Diagnosis not present

## 2018-06-10 DIAGNOSIS — R0982 Postnasal drip: Secondary | ICD-10-CM | POA: Diagnosis not present

## 2018-06-10 DIAGNOSIS — Z955 Presence of coronary angioplasty implant and graft: Secondary | ICD-10-CM | POA: Diagnosis not present

## 2018-06-10 DIAGNOSIS — E785 Hyperlipidemia, unspecified: Secondary | ICD-10-CM | POA: Diagnosis not present

## 2018-06-10 DIAGNOSIS — Z823 Family history of stroke: Secondary | ICD-10-CM | POA: Diagnosis not present

## 2018-06-10 DIAGNOSIS — R0602 Shortness of breath: Secondary | ICD-10-CM | POA: Diagnosis not present

## 2018-06-10 DIAGNOSIS — R569 Unspecified convulsions: Secondary | ICD-10-CM | POA: Diagnosis not present

## 2018-06-10 DIAGNOSIS — J0110 Acute frontal sinusitis, unspecified: Secondary | ICD-10-CM | POA: Diagnosis not present

## 2018-06-21 DIAGNOSIS — Z955 Presence of coronary angioplasty implant and graft: Secondary | ICD-10-CM | POA: Diagnosis not present

## 2018-06-21 DIAGNOSIS — I252 Old myocardial infarction: Secondary | ICD-10-CM | POA: Diagnosis not present

## 2018-06-21 DIAGNOSIS — Z87891 Personal history of nicotine dependence: Secondary | ICD-10-CM | POA: Diagnosis not present

## 2018-06-21 DIAGNOSIS — Z823 Family history of stroke: Secondary | ICD-10-CM | POA: Diagnosis not present

## 2018-06-21 DIAGNOSIS — R0602 Shortness of breath: Secondary | ICD-10-CM | POA: Diagnosis not present

## 2018-06-21 DIAGNOSIS — I2510 Atherosclerotic heart disease of native coronary artery without angina pectoris: Secondary | ICD-10-CM | POA: Diagnosis not present

## 2018-06-21 DIAGNOSIS — I441 Atrioventricular block, second degree: Secondary | ICD-10-CM | POA: Diagnosis not present

## 2018-06-21 DIAGNOSIS — E119 Type 2 diabetes mellitus without complications: Secondary | ICD-10-CM | POA: Diagnosis not present

## 2018-06-22 DIAGNOSIS — J302 Other seasonal allergic rhinitis: Secondary | ICD-10-CM | POA: Diagnosis not present

## 2018-06-22 DIAGNOSIS — Z9229 Personal history of other drug therapy: Secondary | ICD-10-CM | POA: Diagnosis not present

## 2018-06-22 DIAGNOSIS — I440 Atrioventricular block, first degree: Secondary | ICD-10-CM | POA: Diagnosis not present

## 2018-06-22 DIAGNOSIS — J329 Chronic sinusitis, unspecified: Secondary | ICD-10-CM | POA: Diagnosis not present

## 2018-06-22 DIAGNOSIS — Z7902 Long term (current) use of antithrombotics/antiplatelets: Secondary | ICD-10-CM | POA: Diagnosis not present

## 2018-06-22 DIAGNOSIS — I252 Old myocardial infarction: Secondary | ICD-10-CM | POA: Diagnosis not present

## 2018-06-22 DIAGNOSIS — I491 Atrial premature depolarization: Secondary | ICD-10-CM | POA: Diagnosis not present

## 2018-06-22 DIAGNOSIS — T7840XA Allergy, unspecified, initial encounter: Secondary | ICD-10-CM

## 2018-06-22 DIAGNOSIS — Z7982 Long term (current) use of aspirin: Secondary | ICD-10-CM | POA: Diagnosis not present

## 2018-06-22 DIAGNOSIS — Z8709 Personal history of other diseases of the respiratory system: Secondary | ICD-10-CM | POA: Diagnosis not present

## 2018-06-22 DIAGNOSIS — Z87891 Personal history of nicotine dependence: Secondary | ICD-10-CM | POA: Diagnosis not present

## 2018-06-22 DIAGNOSIS — I459 Conduction disorder, unspecified: Secondary | ICD-10-CM

## 2018-06-22 DIAGNOSIS — I2510 Atherosclerotic heart disease of native coronary artery without angina pectoris: Secondary | ICD-10-CM | POA: Diagnosis not present

## 2018-06-22 DIAGNOSIS — E785 Hyperlipidemia, unspecified: Secondary | ICD-10-CM | POA: Diagnosis not present

## 2018-06-22 DIAGNOSIS — E119 Type 2 diabetes mellitus without complications: Secondary | ICD-10-CM | POA: Diagnosis not present

## 2018-06-22 DIAGNOSIS — R001 Bradycardia, unspecified: Secondary | ICD-10-CM | POA: Diagnosis not present

## 2018-06-22 DIAGNOSIS — Z794 Long term (current) use of insulin: Secondary | ICD-10-CM | POA: Diagnosis not present

## 2018-06-22 DIAGNOSIS — Z955 Presence of coronary angioplasty implant and graft: Secondary | ICD-10-CM | POA: Diagnosis not present

## 2018-06-22 DIAGNOSIS — Z79899 Other long term (current) drug therapy: Secondary | ICD-10-CM | POA: Diagnosis not present

## 2018-06-22 DIAGNOSIS — Z8619 Personal history of other infectious and parasitic diseases: Secondary | ICD-10-CM | POA: Diagnosis not present

## 2018-06-22 DIAGNOSIS — I441 Atrioventricular block, second degree: Secondary | ICD-10-CM | POA: Diagnosis not present

## 2018-06-22 HISTORY — DX: Allergy, unspecified, initial encounter: T78.40XA

## 2018-06-22 HISTORY — DX: Conduction disorder, unspecified: I45.9

## 2018-06-26 DIAGNOSIS — I491 Atrial premature depolarization: Secondary | ICD-10-CM

## 2018-06-26 DIAGNOSIS — R001 Bradycardia, unspecified: Secondary | ICD-10-CM | POA: Insufficient documentation

## 2018-06-26 DIAGNOSIS — E785 Hyperlipidemia, unspecified: Secondary | ICD-10-CM | POA: Diagnosis not present

## 2018-06-26 DIAGNOSIS — I2510 Atherosclerotic heart disease of native coronary artery without angina pectoris: Secondary | ICD-10-CM | POA: Diagnosis not present

## 2018-06-26 DIAGNOSIS — I440 Atrioventricular block, first degree: Secondary | ICD-10-CM | POA: Diagnosis not present

## 2018-06-26 HISTORY — DX: Atrial premature depolarization: I49.1

## 2018-06-28 DIAGNOSIS — R0981 Nasal congestion: Secondary | ICD-10-CM | POA: Diagnosis not present

## 2018-06-28 DIAGNOSIS — Z794 Long term (current) use of insulin: Secondary | ICD-10-CM | POA: Diagnosis not present

## 2018-06-28 DIAGNOSIS — I2510 Atherosclerotic heart disease of native coronary artery without angina pectoris: Secondary | ICD-10-CM | POA: Diagnosis not present

## 2018-06-28 DIAGNOSIS — Z79899 Other long term (current) drug therapy: Secondary | ICD-10-CM | POA: Diagnosis not present

## 2018-06-28 DIAGNOSIS — R0789 Other chest pain: Secondary | ICD-10-CM | POA: Diagnosis not present

## 2018-06-28 DIAGNOSIS — I252 Old myocardial infarction: Secondary | ICD-10-CM | POA: Diagnosis not present

## 2018-06-28 DIAGNOSIS — E119 Type 2 diabetes mellitus without complications: Secondary | ICD-10-CM | POA: Diagnosis not present

## 2018-06-28 DIAGNOSIS — Z87891 Personal history of nicotine dependence: Secondary | ICD-10-CM | POA: Diagnosis not present

## 2018-06-28 DIAGNOSIS — I440 Atrioventricular block, first degree: Secondary | ICD-10-CM | POA: Diagnosis not present

## 2018-06-28 DIAGNOSIS — Z7902 Long term (current) use of antithrombotics/antiplatelets: Secondary | ICD-10-CM | POA: Diagnosis not present

## 2018-06-28 DIAGNOSIS — E785 Hyperlipidemia, unspecified: Secondary | ICD-10-CM | POA: Diagnosis not present

## 2018-06-28 DIAGNOSIS — Z7982 Long term (current) use of aspirin: Secondary | ICD-10-CM | POA: Diagnosis not present

## 2018-06-28 DIAGNOSIS — Z823 Family history of stroke: Secondary | ICD-10-CM | POA: Diagnosis not present

## 2018-06-28 DIAGNOSIS — R079 Chest pain, unspecified: Secondary | ICD-10-CM | POA: Diagnosis not present

## 2018-06-28 DIAGNOSIS — Z955 Presence of coronary angioplasty implant and graft: Secondary | ICD-10-CM | POA: Diagnosis not present

## 2018-07-03 ENCOUNTER — Other Ambulatory Visit: Payer: Self-pay | Admitting: *Deleted

## 2018-07-03 ENCOUNTER — Encounter: Payer: Self-pay | Admitting: *Deleted

## 2018-07-03 DIAGNOSIS — E038 Other specified hypothyroidism: Secondary | ICD-10-CM | POA: Diagnosis not present

## 2018-07-03 DIAGNOSIS — D518 Other vitamin B12 deficiency anemias: Secondary | ICD-10-CM | POA: Diagnosis not present

## 2018-07-03 DIAGNOSIS — E119 Type 2 diabetes mellitus without complications: Secondary | ICD-10-CM | POA: Diagnosis not present

## 2018-07-03 DIAGNOSIS — I491 Atrial premature depolarization: Secondary | ICD-10-CM | POA: Diagnosis not present

## 2018-07-03 DIAGNOSIS — E559 Vitamin D deficiency, unspecified: Secondary | ICD-10-CM | POA: Diagnosis not present

## 2018-07-03 DIAGNOSIS — E1165 Type 2 diabetes mellitus with hyperglycemia: Secondary | ICD-10-CM | POA: Diagnosis not present

## 2018-07-04 DIAGNOSIS — I4949 Other premature depolarization: Secondary | ICD-10-CM | POA: Diagnosis not present

## 2018-07-04 DIAGNOSIS — I2510 Atherosclerotic heart disease of native coronary artery without angina pectoris: Secondary | ICD-10-CM | POA: Diagnosis not present

## 2018-07-04 DIAGNOSIS — I498 Other specified cardiac arrhythmias: Secondary | ICD-10-CM | POA: Diagnosis not present

## 2018-07-04 DIAGNOSIS — R002 Palpitations: Secondary | ICD-10-CM | POA: Diagnosis not present

## 2018-07-04 DIAGNOSIS — E119 Type 2 diabetes mellitus without complications: Secondary | ICD-10-CM | POA: Diagnosis not present

## 2018-07-04 DIAGNOSIS — R0602 Shortness of breath: Secondary | ICD-10-CM | POA: Diagnosis not present

## 2018-07-04 DIAGNOSIS — Z7982 Long term (current) use of aspirin: Secondary | ICD-10-CM | POA: Diagnosis not present

## 2018-07-04 DIAGNOSIS — Z823 Family history of stroke: Secondary | ICD-10-CM | POA: Diagnosis not present

## 2018-07-04 DIAGNOSIS — E785 Hyperlipidemia, unspecified: Secondary | ICD-10-CM | POA: Diagnosis not present

## 2018-07-04 DIAGNOSIS — Z87891 Personal history of nicotine dependence: Secondary | ICD-10-CM | POA: Diagnosis not present

## 2018-07-04 DIAGNOSIS — I252 Old myocardial infarction: Secondary | ICD-10-CM | POA: Diagnosis not present

## 2018-07-04 DIAGNOSIS — I440 Atrioventricular block, first degree: Secondary | ICD-10-CM | POA: Diagnosis not present

## 2018-07-04 DIAGNOSIS — Z794 Long term (current) use of insulin: Secondary | ICD-10-CM | POA: Diagnosis not present

## 2018-07-04 DIAGNOSIS — R001 Bradycardia, unspecified: Secondary | ICD-10-CM | POA: Diagnosis not present

## 2018-07-04 DIAGNOSIS — Z7902 Long term (current) use of antithrombotics/antiplatelets: Secondary | ICD-10-CM | POA: Diagnosis not present

## 2018-07-05 ENCOUNTER — Other Ambulatory Visit: Payer: Self-pay

## 2018-07-05 ENCOUNTER — Other Ambulatory Visit: Payer: Self-pay | Admitting: *Deleted

## 2018-07-05 NOTE — Patient Outreach (Signed)
[facility]) Care Management  07/05/2018  Margarita Bobrowski 15-Sep-1950 [id number]   Telephone Screen  Referral Date: 07/05/2018   Referral Source:  Nurse call center  Referral Reason: 07/04/2018 c/o Heart palpitations irregular heartbeat, 2 weeks ago fond his heart was skipping Today he had a spell with his heart skipping and he took his medication. P 48 BP 107/70 He reports that he feel lousy. He feels his heart is skipping. He does fee like he can't get his breath. No chest pain  PMH Diabetes Recommendations to go to ED  Insurance: HTA   Outreach attempt # 1 A  [facility] RN CM reached Malachy Mood, Mr Wyndham daughter at the home number She reports Mr Welchel is not at home request a call to his mobile number after CM discussed HIPAA precautions. She states he went to the ED and is better   Outreach attempt # 1 B to Mr Maraj home number [phone]  Patient is able to verify HIPAA Reviewed and addressed nurse call center referral to [facility] with patient  Mr Winterrowd confirms he did take nurse call center staff's recommendation and went to the [facility] ED for evaluation of palpitations. He reports the ED MD informed him he was okay. Confirms labs and chest xray was completed He confirms his cardiologist, Dr Dwyane Dee is aware of his s/s and is providing a beta blocker trial to manage his bradycardia. Cm answered questions about purpose of beta blocker for bradycardia. Mr Pelcher states he believes "we just need to find the right dose." He reports discussing with Dr Dwyane Dee that he is not needing a pacemaker. He discusses with Cm that he understands that a part of his heart is damaged possibly from he hx of MI, his heart is pumping good but he has skip heart beats. He reports being able to feel his heart beat "ten times then stop for six to eight seconds and beat again. " He reports a hx of a heart rate of 39-40 and has made Dr Dwyane Dee aware. He states at the ED on 07/04/18 his BP ws 107/68 HR 65 O2  sat 99% He reports this am his cbg ws 159. He states he takes the "beta blocker half a tab" tid and prn He states "I don't eat red meat" He confirms he is taking covid 19 precautions (wearing masks and gloves, washing his hands and social distancing)  CM completed transition of care assessment  CM discussed [facility] NPs, Health coach, SW and pharmacy services. Cm offered services to Mr Parodi for health coach services but he denied services   Social: Mr Luedke lives with his wife and family. His family is supportive. He is independent in his care needs and very knowledgeable about his home medical care. He continues to drive or family provide transportation as needed. 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 Cardiologist seen on 07/03/18 & 06/26/18, 06/22/18 admission for bradycardia, 06/21/18 ED visit for sob 06/10/18 ED visits for sinus concerns   DME: glucose meter BP monitor that checks his pulse/heart rate   Medications: He denies concerns with taking medications as prescribed, affording medications, side effects of medications  and questions about medications  He does not receive the flu shot and is reporting he is cautious about receiving it   Appointments: He reports seeing Dr Dwyane Dee on 07/03/18 and was doing well during the office visit    Advance Directives: He has a living will and his daughter is his HPOA   Consent: [facility] RN CM reviewed [facility] services with patient. Patient gave verbal consent for services [facility] telephonic RN CM.   Plan: [facility] RN CM will close case at this time as patient has been assessed and no needs identified/needs resolved.   Pt encouraged to return a call to [facility] CM  prn  [facility] RN CM sent a successful outreach letter as discussed with [facility] brochure enclosed for review  Route note to MDs  Joelene Millin L. Lavina Hamman, RN, BSN, [place] Coordinator Office number [phone] Mobile number [phone]  Main [facility] number [phone] Fax number [phone]

## 2018-07-10 DIAGNOSIS — R001 Bradycardia, unspecified: Secondary | ICD-10-CM | POA: Diagnosis not present

## 2018-07-12 DIAGNOSIS — H524 Presbyopia: Secondary | ICD-10-CM | POA: Diagnosis not present

## 2018-07-12 DIAGNOSIS — H5203 Hypermetropia, bilateral: Secondary | ICD-10-CM | POA: Diagnosis not present

## 2018-07-12 DIAGNOSIS — Z794 Long term (current) use of insulin: Secondary | ICD-10-CM | POA: Diagnosis not present

## 2018-07-12 DIAGNOSIS — E119 Type 2 diabetes mellitus without complications: Secondary | ICD-10-CM | POA: Diagnosis not present

## 2018-07-12 DIAGNOSIS — H52223 Regular astigmatism, bilateral: Secondary | ICD-10-CM | POA: Diagnosis not present

## 2018-10-02 DIAGNOSIS — E782 Mixed hyperlipidemia: Secondary | ICD-10-CM | POA: Diagnosis not present

## 2018-10-02 DIAGNOSIS — E559 Vitamin D deficiency, unspecified: Secondary | ICD-10-CM | POA: Diagnosis not present

## 2018-10-02 DIAGNOSIS — E119 Type 2 diabetes mellitus without complications: Secondary | ICD-10-CM | POA: Diagnosis not present

## 2018-10-02 DIAGNOSIS — E291 Testicular hypofunction: Secondary | ICD-10-CM | POA: Diagnosis not present

## 2018-10-02 DIAGNOSIS — E038 Other specified hypothyroidism: Secondary | ICD-10-CM | POA: Diagnosis not present

## 2018-10-02 DIAGNOSIS — I10 Essential (primary) hypertension: Secondary | ICD-10-CM | POA: Diagnosis not present

## 2018-10-02 DIAGNOSIS — R002 Palpitations: Secondary | ICD-10-CM | POA: Diagnosis not present

## 2018-10-02 DIAGNOSIS — E785 Hyperlipidemia, unspecified: Secondary | ICD-10-CM | POA: Diagnosis not present

## 2018-10-02 DIAGNOSIS — D518 Other vitamin B12 deficiency anemias: Secondary | ICD-10-CM | POA: Diagnosis not present

## 2018-10-02 DIAGNOSIS — E1165 Type 2 diabetes mellitus with hyperglycemia: Secondary | ICD-10-CM | POA: Diagnosis not present

## 2018-10-03 ENCOUNTER — Encounter (HOSPITAL_COMMUNITY): Payer: Self-pay | Admitting: *Deleted

## 2018-10-03 ENCOUNTER — Inpatient Hospital Stay (HOSPITAL_COMMUNITY)
Admission: EM | Admit: 2018-10-03 | Discharge: 2018-10-05 | DRG: 244 | Disposition: A | Discharge status: Home / Self Care | Payer: PPO | Attending: Internal Medicine | Admitting: Internal Medicine

## 2018-10-03 ENCOUNTER — Other Ambulatory Visit: Payer: Self-pay

## 2018-10-03 DIAGNOSIS — Z20828 Contact with and (suspected) exposure to other viral communicable diseases: Secondary | ICD-10-CM | POA: Diagnosis present

## 2018-10-03 DIAGNOSIS — Z8249 Family history of ischemic heart disease and other diseases of the circulatory system: Secondary | ICD-10-CM

## 2018-10-03 DIAGNOSIS — I10 Essential (primary) hypertension: Secondary | ICD-10-CM | POA: Diagnosis present

## 2018-10-03 DIAGNOSIS — Z8674 Personal history of sudden cardiac arrest: Secondary | ICD-10-CM

## 2018-10-03 DIAGNOSIS — Z79899 Other long term (current) drug therapy: Secondary | ICD-10-CM

## 2018-10-03 DIAGNOSIS — E119 Type 2 diabetes mellitus without complications: Secondary | ICD-10-CM

## 2018-10-03 DIAGNOSIS — E1165 Type 2 diabetes mellitus with hyperglycemia: Secondary | ICD-10-CM | POA: Diagnosis present

## 2018-10-03 DIAGNOSIS — Z7982 Long term (current) use of aspirin: Secondary | ICD-10-CM

## 2018-10-03 DIAGNOSIS — Z95818 Presence of other cardiac implants and grafts: Secondary | ICD-10-CM

## 2018-10-03 DIAGNOSIS — Z7902 Long term (current) use of antithrombotics/antiplatelets: Secondary | ICD-10-CM | POA: Diagnosis not present

## 2018-10-03 DIAGNOSIS — I2510 Atherosclerotic heart disease of native coronary artery without angina pectoris: Secondary | ICD-10-CM

## 2018-10-03 DIAGNOSIS — E785 Hyperlipidemia, unspecified: Secondary | ICD-10-CM | POA: Diagnosis present

## 2018-10-03 DIAGNOSIS — R001 Bradycardia, unspecified: Secondary | ICD-10-CM | POA: Diagnosis present

## 2018-10-03 DIAGNOSIS — Z794 Long term (current) use of insulin: Secondary | ICD-10-CM

## 2018-10-03 DIAGNOSIS — I472 Ventricular tachycardia: Secondary | ICD-10-CM | POA: Diagnosis present

## 2018-10-03 DIAGNOSIS — I441 Atrioventricular block, second degree: Secondary | ICD-10-CM | POA: Diagnosis present

## 2018-10-03 DIAGNOSIS — Z87891 Personal history of nicotine dependence: Secondary | ICD-10-CM | POA: Diagnosis not present

## 2018-10-03 DIAGNOSIS — Z9861 Coronary angioplasty status: Secondary | ICD-10-CM | POA: Diagnosis not present

## 2018-10-03 DIAGNOSIS — I252 Old myocardial infarction: Secondary | ICD-10-CM

## 2018-10-03 DIAGNOSIS — R531 Weakness: Secondary | ICD-10-CM | POA: Diagnosis not present

## 2018-10-03 DIAGNOSIS — Z950 Presence of cardiac pacemaker: Secondary | ICD-10-CM | POA: Diagnosis not present

## 2018-10-03 HISTORY — DX: Type 2 diabetes mellitus without complications: E11.9

## 2018-10-03 LAB — BASIC METABOLIC PANEL
Anion gap: 7 (ref 5–15)
BUN: 20 mg/dL (ref 8–23)
CO2: 20 mmol/L — ABNORMAL LOW (ref 22–32)
Calcium: 9.2 mg/dL (ref 8.9–10.3)
Chloride: 108 mmol/L (ref 98–111)
Creatinine, Ser: 1.04 mg/dL (ref 0.61–1.24)
GFR calc Af Amer: >60 mL/min (ref >60)
GFR calc non Af Amer: >60 mL/min (ref >60)
Glucose, Bld: 269 mg/dL — ABNORMAL HIGH (ref 70–99)
Potassium: 4.4 mmol/L (ref 3.5–5.1)
Sodium: 135 mmol/L (ref 135–145)

## 2018-10-03 LAB — CBC
HCT: 43.7 % (ref 39.0–52.0)
Hemoglobin: 14.5 g/dL (ref 13.0–17.0)
MCH: 30.5 pg (ref 26.0–34.0)
MCHC: 33.2 g/dL (ref 30.0–36.0)
MCV: 91.8 fL (ref 80.0–100.0)
Platelets: 238 10*3/uL (ref 150–400)
RBC: 4.76 MIL/uL (ref 4.22–5.81)
RDW: 12.8 % (ref 11.5–15.5)
WBC: 8.8 10*3/uL (ref 4.0–10.5)
nRBC: 0 % (ref 0.0–0.2)

## 2018-10-03 LAB — URINALYSIS, ROUTINE W REFLEX MICROSCOPIC
Bacteria, UA: NONE SEEN
Bilirubin Urine: NEGATIVE
Glucose, UA: >=500 mg/dL — AB
Hgb urine dipstick: NEGATIVE
Ketones, ur: NEGATIVE mg/dL
Leukocytes,Ua: NEGATIVE
Nitrite: NEGATIVE
Protein, ur: NEGATIVE mg/dL
Specific Gravity, Urine: 1.012 (ref 1.005–1.030)
pH: 5 (ref 5.0–8.0)

## 2018-10-03 LAB — CBG MONITORING, ED: Glucose-Capillary: 207 mg/dL — ABNORMAL HIGH (ref 70–99)

## 2018-10-03 MED ORDER — SODIUM CHLORIDE 0.9% FLUSH: 3.0000 mL | Freq: Once | INTRAVENOUS | Status: DC

## 2018-10-03 MED ORDER — SODIUM CHLORIDE 0.9 % IV BOLUS
500.0000 mL | Freq: Once | INTRAVENOUS | Status: AC
  Administered 2018-10-03: 500 mL via INTRAVENOUS

## 2018-10-03 NOTE — H&P (Signed)
History and Physical    Hobert Poplaski ZOX:[id number] DOB: [birth year] DOA: 10/03/2018  PCP: Irven Shelling, MD Patient coming from: Home  Chief Complaint: Generalized weakness, nausea  HPI: Jay Hanson is a 68 y.o. male with medical history significant of hypertension, type 2 diabetes, CAD with history of STEMI and VF arrest in 2014 status post PCI, bradycardia/first-degree AV block, PACs presenting to the hospital for evaluation of generalized weakness and nausea.  Patient states his heart has been skipping beats since May of this year.  He was seen by cardiologist and started on metoprolol 12.5 mg 3 times a day.  States his heart continued to skip beats and so he increased the dose to 4 times a day.  Today he has been feeling very bad.  He is feeling weak and nauseous. While trying to get out of his truck he felt lightheaded and felt like he was going to pass out.  No loss of consciousness.  Denies any chest pain.  Denies abdominal pain and has been eating well.  No other complaints.  ED Course: Afebrile.  Heart rate in the 40s to 50s.  Not hypotensive.  Not hypoxic.  EKG with junctional bradycardia (heart rate 41).  CBC unremarkable.  Blood glucose 269.  Bicarb 20, anion gap 7.  BUN 20, creatinine 1.0.  UA not suggestive of infection.  COVID-19 rapid test negative.  ED provider discussed the case with cardiology, recommended holding metoprolol at this time and monitoring.  If continues to be bradycardic, consult cards in a.m.  Review of Systems:  All systems reviewed and apart from history of presenting illness, are negative.  Past Medical History:  Diagnosis Date  . Coronary artery disease   . MI (myocardial infarction) [facility])     Past Surgical History:  Procedure Laterality Date  . CARDIAC SURGERY       reports that he has quit smoking. He has never used smokeless tobacco. He reports that he does not drink alcohol or use drugs.  No Known Allergies  Family History  Problem Relation  Age of Onset  . Heart disease Brother     Prior to Admission medications   Medication Sig Start Date End Date Taking? Authorizing Provider  aspirin EC 81 MG tablet Take 81 mg by mouth daily.    [provider]  atorvastatin (LIPITOR) 80 MG tablet Take by mouth. 06/22/18   [provider]  busPIRone (BUSPAR) 10 MG tablet Take 10 mg by mouth 2 (two) times daily. 07/28/17   [provider]  cetirizine (ZYRTEC) 10 MG tablet Take by mouth. 06/10/18 07/10/18  [provider]  clopidogrel (PLAVIX) 75 MG tablet Take 37.5 mg by mouth daily.  06/15/17   [provider]  dextromethorphan-guaiFENesin (MUCINEX DM) 30-600 MG 12hr tablet Take 1 tablet by mouth 2 (two) times daily.    [provider]  fluticasone (FLONASE) 50 MCG/ACT nasal spray Place 1 spray into both nostrils daily as needed for allergies. 06/26/17   [provider]  fluticasone (FLONASE) 50 MCG/ACT nasal spray 1 spray by Each Nare route daily. 06/23/18 06/23/19  [provider]  glucose blood (ONE TOUCH ULTRA TEST) test strip USE TO CHECK GLUCOSE 4 TIMES DAILY 06/11/18   [provider]  insulin aspart (NOVOLOG) 100 UNIT/ML injection Inject into the skin.    [provider]  insulin detemir (LEVEMIR) 100 UNIT/ML injection Inject 36 Units into the skin 2 (two) times daily.    [provider]  metoCLOPramide (REGLAN)  10 MG tablet Take 10 mg by mouth daily. 06/29/17   [provider]  metoprolol tartrate (LOPRESSOR) 25 MG tablet Take by mouth. 06/26/18 07/26/18  [provider]  sitaGLIPtin-metformin (JANUMET) 50-500 MG tablet Take by mouth.    [provider]  Vitamin D, Ergocalciferol, (DRISDOL) 1.25 MG (50000 UT) CAPS capsule Take by mouth.    [provider]    Physical Exam: Vitals:   10/03/18 2300 10/03/18 2330 10/04/18 0015 10/04/18 0100  BP: 123/73 130/75 129/76 132/71  Pulse:  (!) 43 (!) 42 (!) 57  Resp: (!) 22  20 (!) 21 (!) 27  Temp:      TempSrc:      SpO2:  98% 100% 100%  Weight:      Height:        Physical Exam  Constitutional: He is oriented to person, place, and time. He appears well-developed and well-nourished. No distress.  HENT:  Head: Normocephalic.  Mouth/Throat: Oropharynx is clear and moist.  Eyes: Right eye exhibits no discharge. Left eye exhibits no discharge.  Neck: Neck supple.  Cardiovascular: Regular rhythm and intact distal pulses.  Bradycardic with heart rate in the 30s to 40s  Pulmonary/Chest: Effort normal and breath sounds normal. No respiratory distress. He has no wheezes. He has no rales.  Abdominal: Soft. Bowel sounds are normal. He exhibits no distension. There is no abdominal tenderness. There is no guarding.  Musculoskeletal:        General: No edema.  Neurological: He is alert and oriented to person, place, and time.  Skin: Skin is warm and dry. He is not diaphoretic.     Labs on Admission: I have personally reviewed following labs and imaging studies  CBC: Recent Labs  Lab 10/03/18 1908  WBC 8.8  HGB 14.5  HCT 43.7  MCV 91.8  PLT 195   Basic Metabolic Panel: Recent Labs  Lab 10/03/18 1908  NA 135  K 4.4  CL 108  CO2 20*  GLUCOSE 269*  BUN 20  CREATININE 1.04  CALCIUM 9.2   GFR: Estimated Creatinine Clearance: 80.6 mL/min (by C-G formula based on SCr of 1.04 mg/dL). Liver Function Tests: No results for input(s): AST, ALT, ALKPHOS, BILITOT, PROT, ALBUMIN in the last 168 hours. No results for input(s): LIPASE, AMYLASE in the last 168 hours. No results for input(s): AMMONIA in the last 168 hours. Coagulation Profile: No results for input(s): INR, PROTIME in the last 168 hours. Cardiac Enzymes: No results for input(s): CKTOTAL, CKMB, CKMBINDEX, TROPONINI in the last 168 hours. BNP (last 3 results) No results for input(s): PROBNP in the last 8760 hours. HbA1C: No results for input(s): HGBA1C in the last 72 hours. CBG: Recent Labs   Lab 10/03/18 2023  GLUCAP 207*   Lipid Profile: No results for input(s): CHOL, HDL, LDLCALC, TRIG, CHOLHDL, LDLDIRECT in the last 72 hours. Thyroid Function Tests: No results for input(s): TSH, T4TOTAL, FREET4, T3FREE, THYROIDAB in the last 72 hours. Anemia Panel: No results for input(s): VITAMINB12, FOLATE, FERRITIN, TIBC, IRON, RETICCTPCT in the last 72 hours. Urine analysis:    Component Value Date/Time   COLORURINE YELLOW 10/03/2018 1938   APPEARANCEUR CLEAR 10/03/2018 1938   LABSPEC 1.012 10/03/2018 1938   PHURINE 5.0 10/03/2018 1938   GLUCOSEU >=500 (A) 10/03/2018 1938   HGBUR NEGATIVE 10/03/2018 1938   BILIRUBINUR NEGATIVE 10/03/2018 1938   KETONESUR NEGATIVE 10/03/2018 1938   PROTEINUR NEGATIVE 10/03/2018 1938   NITRITE NEGATIVE 10/03/2018 1938   LEUKOCYTESUR NEGATIVE 10/03/2018  1938    Radiological Exams on Admission: No results found.  EKG: Independently reviewed.  Junctional bradycardia (heart rate 41).  Assessment/Plan Principal Problem:   Bradycardia Active Problems:   CAD (coronary artery disease)   HLD (hyperlipidemia)   Type 2 diabetes mellitus (HCC)   Bradycardia Patient has a history of bradycardia, first-degree AV block, PACs.  He is followed by cardiology at [facility] and was started on low-dose beta-blocker as blocked PACs were thought to be causing his bradycardia.  Cardiology had advised him to take metoprolol tartrate 12.5 mg 3 times daily at that time, however, patient is currently taking it 4 times a day.  This is likely contributing to his bradycardia.  EKG with junctional bradycardia.  Heart rate in the 30s to 40s at present and appears to be sinus bradycardia on the monitor.  Although symptomatic from his bradycardia, no loss of consciousness or hemodynamic instability. -Cardiac monitoring -Hold metoprolol.  If patient continues to be bradycardic, reconsult cardiology in a.m. -If significant symptoms or signs of hemodynamic instability, give  Atropine. -Repeat EKG in a.m. 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 -Continue home aspirin, Plavix, statin -Hold metoprolol at this time given bradycardia  Hyperlipidemia -Continue home Lipitor  Type 2 diabetes Blood glucose elevated. -Check A1c.  Sliding scale insulin and CBG checks.  DVT prophylaxis: Lovenox Code Status: Patient wishes to be full code. Family Communication: No family available at this time. Disposition Plan: Anticipate discharge after clinical improvement. Consults called: None Admission status: It is my clinical opinion that referral for OBSERVATION is reasonable and necessary in this patient based on the above information provided. The aforementioned taken together are felt to place the patient at high risk for further clinical deterioration. However it is anticipated that the patient may be medically stable for discharge from the hospital within 24 to 48 hours.  The medical decision making on this patient was of high complexity and the patient is at high risk for clinical deterioration, therefore this is a level 3 visit.  Shela Leff MD [facility] Pager [phone]  If 7PM-7AM, please contact night-coverage [email or web address] Password [email or web address]  10/04/2018, 1:15 AM

## 2018-10-03 NOTE — ED Triage Notes (Signed)
To ED for eval after feeling weak and nauseated. States felt heart palpitations. Has been on beta blockers since April. CAD hx. No cp. No SOB. States he feels weak currently. Lopressor taken at 1600

## 2018-10-03 NOTE — ED Notes (Signed)
ED TO INPATIENT HANDOFF REPORT  ED Nurse Name and Phone #:  [phone]  S Name/Age/Gender Jay Hanson 68 y.o. male Room/Bed: 022C/022C  Code Status   Code Status: Not on file  Home/SNF/Other Home Patient oriented to: self, place, time and situation Is this baseline? Yes   Triage Complete: Triage complete  Chief Complaint chest palpitations  Triage Note To ED for eval after feeling weak and nauseated. States felt heart palpitations. Has been on beta blockers since April. CAD hx. No cp. No SOB. States he feels weak currently. Lopressor taken at 1600   Allergies No Known Allergies  Level of Care/Admitting Diagnosis ED Disposition    None      B Medical/Surgery History Past Medical History:  Diagnosis Date  . Coronary artery disease   . MI (myocardial infarction) [facility])    Past Surgical History:  Procedure Laterality Date  . CARDIAC SURGERY       A IV Location/Drains/Wounds Patient Lines/Drains/Airways Status   Active Line/Drains/Airways    Name:   Placement date:   Placement time:   Site:   Days:   Peripheral IV 10/03/18 Left Hand   10/03/18    2028    Hand   less than 1          Intake/Output Last 24 hours No intake or output data in the 24 hours ending 10/03/18 2322  Labs/Imaging Results for orders placed or performed during the hospital encounter of 10/03/18 (from the past 48 hour(s))  Basic metabolic panel     Status: Abnormal   Collection Time: 10/03/18  7:08 PM  Result Value Ref Range   Sodium 135 135 - 145 mmol/L   Potassium 4.4 3.5 - 5.1 mmol/L   Chloride 108 98 - 111 mmol/L   CO2 20 (L) 22 - 32 mmol/L   Glucose, Bld 269 (H) 70 - 99 mg/dL   BUN 20 8 - 23 mg/dL   Creatinine, Ser 1.04 0.61 - 1.24 mg/dL   Calcium 9.2 8.9 - 10.3 mg/dL   GFR calc non Af Amer >60 >60 mL/min   GFR calc Af Amer >60 >60 mL/min   Anion gap 7 5 - 15    Comment: Performed at [facility], [place] [address], [place], [place] [address]  CBC     Status: None   Collection Time: 10/03/18  7:08 PM  Result Value Ref Range   WBC 8.8 4.0 - 10.5 K/uL   RBC 4.76 4.22 - 5.81 MIL/uL   Hemoglobin 14.5 13.0 - 17.0 g/dL   HCT 43.7 39.0 - 52.0 %   MCV 91.8 80.0 - 100.0 fL   MCH 30.5 26.0 - 34.0 pg   MCHC 33.2 30.0 - 36.0 g/dL   RDW 12.8 11.5 - 15.5 %   Platelets 238 150 - 400 K/uL   nRBC 0.0 0.0 - 0.2 %    Comment: Performed at [facility], [place] [address] [place], [place] [address]  Urinalysis, Routine w reflex microscopic     Status: Abnormal   Collection Time: 10/03/18  7:38 PM  Result Value Ref Range   Color, Urine YELLOW YELLOW   APPearance CLEAR CLEAR   Specific Gravity, Urine 1.012 1.005 - 1.030   pH 5.0 5.0 - 8.0   Glucose, UA >=500 (A) NEGATIVE mg/dL   Hgb urine dipstick NEGATIVE NEGATIVE   Bilirubin Urine NEGATIVE NEGATIVE   Ketones, ur NEGATIVE NEGATIVE mg/dL   Protein, ur NEGATIVE NEGATIVE mg/dL   Nitrite NEGATIVE  NEGATIVE   Leukocytes,Ua NEGATIVE NEGATIVE   RBC / HPF 0-5 0 - 5 RBC/hpf   WBC, UA 0-5 0 - 5 WBC/hpf   Bacteria, UA NONE SEEN NONE SEEN   Squamous Epithelial / LPF 0-5 0 - 5   Mucus PRESENT     Comment: Performed at [facility], [place] [address], [place], [place] [address]  CBG monitoring, ED     Status: Abnormal   Collection Time: 10/03/18  8:23 PM  Result Value Ref Range   Glucose-Capillary 207 (H) 70 - 99 mg/dL   No results found.  Pending Labs Unresulted Labs (From admission, onward)    Start     Ordered   10/03/18 2130  SARS Coronavirus 2 (CEPHEID - Performed in [place] hospital lab), Hosp Order  (Asymptomatic Patients Labs)  Once,   STAT    Question:  Rule Out  Answer:  Yes   10/03/18 2130          Vitals/Pain Today's Vitals   10/03/18 2215 10/03/18 2230 10/03/18 2234 10/03/18 2245  BP: 128/74 128/75  126/78  Pulse: (!) 39 (!) 55  (!) 46  Resp: (!) 21 16  16   Temp:      TempSrc:      SpO2: 99% 98%  99%  Weight:      Height:      PainSc:   0-No pain     Isolation Precautions No  active isolations  Medications Medications  sodium chloride flush (NS) 0.9 % injection 3 mL (has no administration in time range)  sodium chloride 0.9 % bolus 500 mL (500 mLs Intravenous New Bag/Given 10/03/18 2315)    Mobility walks Low fall risk   Focused Assessments Cardiac Assessment Handoff:    No results found for: CKTOTAL, CKMB, CKMBINDEX, TROPONINI No results found for: DDIMER Does the Patient currently have chest pain? No      R Recommendations: See Admitting Provider Note  Report given to:   Additional Notes:

## 2018-10-04 ENCOUNTER — Encounter (HOSPITAL_COMMUNITY): Payer: Self-pay | Admitting: Internal Medicine

## 2018-10-04 ENCOUNTER — Inpatient Hospital Stay (HOSPITAL_COMMUNITY)
Admission: EM | Disposition: A | Discharge status: Home / Self Care | Payer: Self-pay | Source: Home / Self Care | Attending: Internal Medicine

## 2018-10-04 DIAGNOSIS — Z8674 Personal history of sudden cardiac arrest: Secondary | ICD-10-CM | POA: Diagnosis not present

## 2018-10-04 DIAGNOSIS — I2510 Atherosclerotic heart disease of native coronary artery without angina pectoris: Secondary | ICD-10-CM

## 2018-10-04 DIAGNOSIS — I252 Old myocardial infarction: Secondary | ICD-10-CM | POA: Diagnosis not present

## 2018-10-04 DIAGNOSIS — Z87891 Personal history of nicotine dependence: Secondary | ICD-10-CM | POA: Diagnosis not present

## 2018-10-04 DIAGNOSIS — Z7902 Long term (current) use of antithrombotics/antiplatelets: Secondary | ICD-10-CM | POA: Diagnosis not present

## 2018-10-04 DIAGNOSIS — E119 Type 2 diabetes mellitus without complications: Secondary | ICD-10-CM

## 2018-10-04 DIAGNOSIS — Z79899 Other long term (current) drug therapy: Secondary | ICD-10-CM | POA: Diagnosis not present

## 2018-10-04 DIAGNOSIS — Z8249 Family history of ischemic heart disease and other diseases of the circulatory system: Secondary | ICD-10-CM | POA: Diagnosis not present

## 2018-10-04 DIAGNOSIS — I472 Ventricular tachycardia: Secondary | ICD-10-CM | POA: Diagnosis present

## 2018-10-04 DIAGNOSIS — Z7982 Long term (current) use of aspirin: Secondary | ICD-10-CM | POA: Diagnosis not present

## 2018-10-04 DIAGNOSIS — Z794 Long term (current) use of insulin: Secondary | ICD-10-CM | POA: Diagnosis not present

## 2018-10-04 DIAGNOSIS — E785 Hyperlipidemia, unspecified: Secondary | ICD-10-CM

## 2018-10-04 DIAGNOSIS — I10 Essential (primary) hypertension: Secondary | ICD-10-CM | POA: Diagnosis present

## 2018-10-04 DIAGNOSIS — R001 Bradycardia, unspecified: Secondary | ICD-10-CM | POA: Diagnosis present

## 2018-10-04 DIAGNOSIS — Z20828 Contact with and (suspected) exposure to other viral communicable diseases: Secondary | ICD-10-CM | POA: Diagnosis present

## 2018-10-04 DIAGNOSIS — Z9861 Coronary angioplasty status: Secondary | ICD-10-CM | POA: Diagnosis not present

## 2018-10-04 DIAGNOSIS — I441 Atrioventricular block, second degree: Secondary | ICD-10-CM | POA: Diagnosis present

## 2018-10-04 DIAGNOSIS — E1165 Type 2 diabetes mellitus with hyperglycemia: Secondary | ICD-10-CM | POA: Diagnosis present

## 2018-10-04 HISTORY — DX: Atherosclerotic heart disease of native coronary artery without angina pectoris: I25.10

## 2018-10-04 HISTORY — PX: PACEMAKER IMPLANT: EP1218

## 2018-10-04 HISTORY — DX: Hyperlipidemia, unspecified: E78.5

## 2018-10-04 HISTORY — DX: Type 2 diabetes mellitus without complications: E11.9

## 2018-10-04 HISTORY — DX: Bradycardia, unspecified: R00.1

## 2018-10-04 LAB — HEMOGLOBIN A1C
Hgb A1c MFr Bld: 9 % — ABNORMAL HIGH (ref 4.8–5.6)
Mean Plasma Glucose: 211.6 mg/dL

## 2018-10-04 LAB — HIV ANTIBODY (ROUTINE TESTING W REFLEX): HIV Screen 4th Generation wRfx: NONREACTIVE

## 2018-10-04 LAB — SURGICAL PCR SCREEN
MRSA, PCR: NEGATIVE
Staphylococcus aureus: NEGATIVE

## 2018-10-04 LAB — TSH: TSH: 2.27 u[IU]/mL (ref 0.350–4.500)

## 2018-10-04 LAB — GLUCOSE, CAPILLARY
Glucose-Capillary: 189 mg/dL — ABNORMAL HIGH (ref 70–99)
Glucose-Capillary: 210 mg/dL — ABNORMAL HIGH (ref 70–99)
Glucose-Capillary: 168 mg/dL — ABNORMAL HIGH (ref 70–99)
Glucose-Capillary: 199 mg/dL — ABNORMAL HIGH (ref 70–99)

## 2018-10-04 LAB — SARS CORONAVIRUS 2 BY RT PCR (HOSPITAL ORDER, PERFORMED IN ~~LOC~~ HOSPITAL LAB): SARS Coronavirus 2: NEGATIVE

## 2018-10-04 LAB — T4, FREE: Free T4: 0.86 ng/dL (ref 0.61–1.12)

## 2018-10-04 SURGERY — PACEMAKER IMPLANT

## 2018-10-04 MED ORDER — CLOPIDOGREL BISULFATE 75 MG PO TABS
75.0000 mg | ORAL_TABLET | Freq: Every day | ORAL | Status: DC
  Administered 2018-10-04 – 2018-10-05 (×2): 75 mg via ORAL
  Filled 2018-10-04 (×2): qty 1

## 2018-10-04 MED ORDER — ATORVASTATIN CALCIUM 80 MG PO TABS
80.0000 mg | ORAL_TABLET | Freq: Every day | ORAL | Status: DC
  Administered 2018-10-04: 80 mg via ORAL
  Filled 2018-10-04: qty 1

## 2018-10-04 MED ORDER — CEFAZOLIN SODIUM-DEXTROSE 2-4 GM/100ML-% IV SOLN
2.0000 g | INTRAVENOUS | Status: AC
  Administered 2018-10-04: 16:00:00 2 g via INTRAVENOUS

## 2018-10-04 MED ORDER — SODIUM CHLORIDE 0.9 % IV SOLN: INTRAVENOUS | Status: DC

## 2018-10-04 MED ORDER — LIDOCAINE HCL 1 % IJ SOLN
INTRAMUSCULAR | Status: AC
  Filled 2018-10-04: qty 20

## 2018-10-04 MED ORDER — INSULIN ASPART 100 UNIT/ML ~~LOC~~ SOLN
0.0000 [IU] | Freq: Three times a day (TID) | SUBCUTANEOUS | Status: DC
  Administered 2018-10-04: 07:00:00 2 [IU] via SUBCUTANEOUS
  Administered 2018-10-04: 14:00:00 3 [IU] via SUBCUTANEOUS
  Administered 2018-10-05: 2 [IU] via SUBCUTANEOUS
  Administered 2018-10-05: 06:00:00 3 [IU] via SUBCUTANEOUS

## 2018-10-04 MED ORDER — MIDAZOLAM HCL 5 MG/5ML IJ SOLN
INTRAMUSCULAR | Status: AC
  Filled 2018-10-04: qty 5

## 2018-10-04 MED ORDER — SODIUM CHLORIDE 0.9% FLUSH
3.0000 mL | Freq: Two times a day (BID) | INTRAVENOUS | Status: DC
  Administered 2018-10-04 – 2018-10-05 (×2): 3 mL via INTRAVENOUS

## 2018-10-04 MED ORDER — ACETAMINOPHEN 650 MG RE SUPP: 650.0000 mg | Freq: Four times a day (QID) | RECTAL | Status: DC | PRN

## 2018-10-04 MED ORDER — SODIUM CHLORIDE 0.9% FLUSH: 3.0000 mL | INTRAVENOUS | Status: DC | PRN

## 2018-10-04 MED ORDER — FENTANYL CITRATE (PF) 100 MCG/2ML IJ SOLN
INTRAMUSCULAR | Status: AC
  Filled 2018-10-04: qty 2

## 2018-10-04 MED ORDER — ACETAMINOPHEN 325 MG PO TABS: 650.0000 mg | ORAL_TABLET | Freq: Four times a day (QID) | ORAL | Status: DC | PRN

## 2018-10-04 MED ORDER — LIDOCAINE HCL (PF) 1 % IJ SOLN
INTRAMUSCULAR | Status: DC | PRN
  Administered 2018-10-04: 45 mL

## 2018-10-04 MED ORDER — CEFAZOLIN SODIUM-DEXTROSE 1-4 GM/50ML-% IV SOLN
1.0000 g | Freq: Four times a day (QID) | INTRAVENOUS | Status: AC
  Administered 2018-10-04 – 2018-10-05 (×3): 1 g via INTRAVENOUS
  Filled 2018-10-04 (×3): qty 50

## 2018-10-04 MED ORDER — CEFAZOLIN SODIUM-DEXTROSE 2-4 GM/100ML-% IV SOLN
INTRAVENOUS | Status: AC
  Filled 2018-10-04: qty 100

## 2018-10-04 MED ORDER — SODIUM CHLORIDE 0.9 % IV SOLN
INTRAVENOUS | Status: AC
  Filled 2018-10-04: qty 2

## 2018-10-04 MED ORDER — CHLORHEXIDINE GLUCONATE 4 % EX LIQD: 60.0000 mL | Freq: Once | CUTANEOUS | Status: DC

## 2018-10-04 MED ORDER — ASPIRIN EC 81 MG PO TBEC
81.0000 mg | DELAYED_RELEASE_TABLET | Freq: Every day | ORAL | Status: DC
  Administered 2018-10-04 – 2018-10-05 (×2): 81 mg via ORAL
  Filled 2018-10-04 (×2): qty 1

## 2018-10-04 MED ORDER — METOPROLOL TARTRATE 25 MG PO TABS
25.0000 mg | ORAL_TABLET | Freq: Two times a day (BID) | ORAL | Status: DC
  Administered 2018-10-04 – 2018-10-05 (×2): 25 mg via ORAL
  Filled 2018-10-04 (×2): qty 1

## 2018-10-04 MED ORDER — SODIUM CHLORIDE 0.9 % IV SOLN
80.0000 mg | INTRAVENOUS | Status: AC
  Administered 2018-10-04: 17:00:00 80 mg

## 2018-10-04 MED ORDER — HEPARIN (PORCINE) IN NACL 1000-0.9 UT/500ML-% IV SOLN
INTRAVENOUS | Status: DC | PRN
  Administered 2018-10-04: 500 mL

## 2018-10-04 MED ORDER — HEPARIN (PORCINE) IN NACL 1000-0.9 UT/500ML-% IV SOLN
INTRAVENOUS | Status: AC
  Filled 2018-10-04: qty 500

## 2018-10-04 MED ORDER — ONDANSETRON HCL 4 MG/2ML IJ SOLN: 4.0000 mg | Freq: Four times a day (QID) | INTRAMUSCULAR | Status: DC | PRN

## 2018-10-04 MED ORDER — INSULIN DETEMIR 100 UNIT/ML ~~LOC~~ SOLN
20.0000 [IU] | Freq: Two times a day (BID) | SUBCUTANEOUS | Status: DC
  Administered 2018-10-04 (×2): 20 [IU] via SUBCUTANEOUS
  Filled 2018-10-04 (×4): qty 0.2

## 2018-10-04 MED ORDER — CLOPIDOGREL BISULFATE 75 MG PO TABS: 37.5000 mg | ORAL_TABLET | Freq: Every day | ORAL | Status: DC

## 2018-10-04 MED ORDER — SODIUM CHLORIDE 0.9 % IV SOLN: 250.0000 mL | INTRAVENOUS | Status: DC

## 2018-10-04 MED ORDER — INSULIN ASPART 100 UNIT/ML ~~LOC~~ SOLN: 0.0000 [IU] | Freq: Every day | SUBCUTANEOUS | Status: DC

## 2018-10-04 MED ORDER — ENOXAPARIN SODIUM 40 MG/0.4ML ~~LOC~~ SOLN
40.0000 mg | Freq: Every day | SUBCUTANEOUS | Status: DC
  Administered 2018-10-04: 09:00:00 40 mg via SUBCUTANEOUS
  Filled 2018-10-04: qty 0.4

## 2018-10-04 SURGICAL SUPPLY — 7 items
CABLE SURGICAL S-101-97-12 (CABLE) ×3 IMPLANT
LEAD TENDRIL MRI 52CM LPA1200M (Lead) ×2 IMPLANT
LEAD TENDRIL MRI 58CM LPA1200M (Lead) ×2 IMPLANT
PACEMAKER ASSURITY DR-RF (Pacemaker) ×2 IMPLANT
PAD PRO RADIOLUCENT 2001M-C (PAD) ×3 IMPLANT
SHEATH 8FR PRELUDE SNAP 13 (SHEATH) ×4 IMPLANT
TRAY PACEMAKER INSERTION (PACKS) ×3 IMPLANT

## 2018-10-04 NOTE — Consult Note (Addendum)
Cardiology Consultation:   Patient ID: Jay Hanson MRN: [id number]; DOB: May 07, 1950  Admit date: 10/03/2018 Date of Consult: 10/04/2018  Primary Care Provider: Irven Shelling, MD Primary Cardiologist: Dr. Dwyane Dee [facility]) Primary Electrophysiologist:  None    Patient Profile:   Jay Hanson is a 68 y.o. male with a hx of HTN, DM, CAD (prior STEMI w/ PCI in 2014 at [facility], associated with cardiac arrest) who is being seen today for the evaluation of bradycardia at the request of Dr. Horton Chin.  History of Present Illness:   Jay Hanson follows with dr. Dwyane Dee [facility]) for cardiology, the last note I see in care everywhere was end of April this year, Dr. Dwyane Dee notes that Initially thought to have bradycardia with Mobitz type I second-degree AV block, however, he was actually found to have frequent PACs with some blocked PACs He reported that the patient was feeling skipped beats due to these PACs and also feels dizzy at times. However while at [facility] he was made to walk in the hallway and chronotropic response was good. He was discharged with a cardiac monitor.  He was started on lopressor 12.5mg  BID, the patient made observation that his palpitations were better though back mid-day and self increased to TID with good suppression of his symptoms.   Ultimately it appears he had further palpitations and was changed 07/19/2018 Toprol 25mg  BID, phone notes mention the patient also taking an extra PRN dose. 07/26/2018 phone note with pt again with palpitations also noting slower HR and requested to return to TID lopressor which was approved.  He was admitted to [facility] yesterday with c/o progressive weakness, near syncope, and ongoing palpitations (that he self-increased his lopressor to QID for recently).  He was noted to be bradycardic with rates 40's-50's reported as junctional, he was admitted his metoprolol held.  EP is asked to see  LABS K+ 4.4 BUN/Creat 20/1.04 WBC 8.8 H/H 14/43 Plts 238  TSH 2.270  COVID negative   Half life of lopressor is 3-4 hours, last dose was 1600 yesterday No other potential nodal blocking agents are noted on home med list or here  The patient states that he had been feeling very well without symptoms until about April.  He started getting palpitations that were very bothersome, making him feel very SOB.  The SOB is what prompted his visit to the hospital in April (discussed above).  He states he was told his heart rhythm was "out of synch" and that was why he was SOB.  He will occassionally feel a very fleeting stick of CP though none otherwise.  He says since April with the palpitations, medicines, medicine adjustments he has not felt well.  He admits there are some days he has good heart rhythm and feels very good, but the majority of the time he is quite bothered by it , making him feel nauseous and fatigued.  Mentions he is tired of feeling tired.  Yesterday when getting out of his truck he suddenly became very weak and for a moment thought he was going to faint.  This prompted him coming in.  He has not fainted.   Heart Pathway Score:     Past Medical History:  Diagnosis Date  . Coronary artery disease   . MI (myocardial infarction) [facility])     Past Surgical History:  Procedure Laterality Date  . CARDIAC SURGERY       Home Medications:  Prior to Admission medications   Medication Sig Start Date End  Date Taking? Authorizing Provider  aspirin EC 81 MG tablet Take 81 mg by mouth daily.   Yes [provider]  cetirizine (ZYRTEC) 10 MG tablet Take 10 mg by mouth daily.  06/10/18 10/04/18 Yes [provider]  Cholecalciferol (VITAMIN D3) 125 MCG (5000 UT) TABS Take 5,000 Units by mouth daily.   Yes [provider]  clopidogrel (PLAVIX) 75 MG tablet Take 37.5 mg by mouth daily.  06/15/17  Yes [provider]  fluticasone (FLONASE) 50 MCG/ACT nasal spray Place 1 spray into both nostrils daily as needed for allergies.  06/26/17  Yes [provider]  insulin detemir (LEVEMIR) 100 UNIT/ML injection Inject 40 Units into the skin 2 (two) times daily.    Yes [provider]  metoprolol tartrate (LOPRESSOR) 25 MG tablet Take 12.5 mg by mouth 4 (four) times daily.  06/26/18 10/04/18 Yes [provider]  pantoprazole (PROTONIX) 40 MG tablet Take 40 mg by mouth daily. 08/20/18  Yes [provider]  sitaGLIPtin (JANUVIA) 100 MG tablet Take 100 mg by mouth daily.   Yes [provider]  glucose blood (ONE TOUCH ULTRA TEST) test strip USE TO CHECK GLUCOSE 4 TIMES DAILY 06/11/18   [provider]    Inpatient Medications: Scheduled Meds: . aspirin EC  81 mg Oral Daily  . atorvastatin  80 mg Oral q1800  . clopidogrel  75 mg Oral Daily  . enoxaparin (LOVENOX) injection  40 mg Subcutaneous Daily  . insulin aspart  0-5 Units Subcutaneous QHS  . insulin aspart  0-9 Units Subcutaneous TID WC  . insulin detemir  20 Units Subcutaneous BID  . sodium chloride flush  3 mL Intravenous Once   Continuous Infusions:  PRN Meds: acetaminophen **OR** acetaminophen  Allergies:   No Known Allergies  Social History:   Social History   Socioeconomic History  . Marital status: Married    Spouse name: Not on file  . Number of children: Not on file  . Years of education: Not on file  . Highest education level: Not on file  Occupational History  . Not on file  Social Needs  . Financial resource strain: Not on file  . Food insecurity    Worry: Not on file    Inability: Not on file  . Transportation needs    Medical: Not on file    Non-medical: Not on file  Tobacco Use  . Smoking status: Former Research scientist (life sciences)  . Smokeless tobacco: Never Used  Substance and Sexual Activity  . Alcohol use: Never    Frequency: Never  . Drug use: Never  . Sexual activity: Not on file  Lifestyle  . Physical activity    Days per week: Not on file    Minutes per session: Not on file  . Stress: Not on  file  Relationships  . Social Herbalist on phone: Not on file    Gets together: Not on file    Attends religious service: Not on file    Active member of club or organization: Not on file    Attends meetings of clubs or organizations: Not on file    Relationship status: Not on file  . Intimate partner violence    Fear of current or ex partner: Not on file    Emotionally abused: Not on file    Physically abused: Not on file    Forced sexual activity: Not on file  Other Topics Concern  . Not on file  Social  History Narrative  . Not on file    Family History:   Family History  Problem Relation Age of Onset  . Heart disease Brother      ROS:  Please see the history of present illness. All other ROS reviewed and negative.     Physical Exam/Data:   Vitals:   10/04/18 0157 10/04/18 0200 10/04/18 0500 10/04/18 1123  BP: (!) 148/75 (!) 148/75 136/74 117/61  Pulse: (!) 36 (!) 36 (!) 33 (!) 44  Resp: 20 19 (!) 21 19  Temp: 97.8 F (36.6 C)  (!) 97.5 F (36.4 C)   TempSrc: Oral  Oral   SpO2: 100% 99% 100% 99%  Weight: 108.7 kg     Height: 5\' 8"  (1.727 m)       Intake/Output Summary (Last 24 hours) at 10/04/2018 1200 Last data filed at 10/04/2018 0502 Gross per 24 hour  Intake 500 ml  Output 1000 ml  Net -500 ml   Last 3 Weights 10/04/2018 10/03/2018  Weight (lbs) 239 lb 11.2 oz 236 lb  Weight (kg) 108.727 kg 107.049 kg     Body mass index is 36.45 kg/m.  General:  Well nourished, well developed, in no acute distress HEENT: normal Lymph: no adenopathy Neck: no JVD Endocrine:  No thryomegaly Vascular: No carotid bruits  Cardiac:  RRR; extrasystoles, bradycardic, no murmurs, gallops or rubs Lungs:  CTA b/l, no wheezing, rhonchi or rales  Abd: soft, nontender, obese  Ext: no edema Musculoskeletal:  No deformities Skin: warm and dry  Neuro:   No gross focal abnormalities noted Psych:  Normal affect   EKG:  The EKG was personally reviewed and  demonstrates:   SB with conducted beats and junctional escape beats as well, V rate 41bpm, QRS 41 Today is SB 50bpm, 1st degree AVblock 2236ms, the beginning of the tracing is suspect is Mobiz I, not clearly higher block to me) Telemetry:  Telemetry was personally reviewed and demonstrates:   He has SB/SR 40's-50's with 1:1 conduction, periods of Mobitz I with periods of 2:1 conduction as well.  Rates generally about 40-45 When I am with him and he is talking moving his arms, his rates/condustion do improve slightly to high 40's-50 range  Relevant CV Studies:  Narrative from 48hour monitor, 07/10/2018 (unable to veiw tracings) Patient had a min HR of 25 bpm, max HR of 174 bpm, and avg HR of 58 bpm. Predominant underlying rhythm was Sinus Rhythm. First Degree AV Block was present. 1 run of Ventricular Tachycardia occurred lasting 4 beats with a max rate of 174 bpm (avg 137 bpm) which followed a ventricular couplet. Second Degree AV Block-Mobitz I (Wenckebach) was present. Isolated SVEs were rare (<1.0%), SVE Couplets were rare (<1.0%), and no SVE Triplets were present. Isolated VEs were rare (<1.0%), and no VE Couplets or VE Triplets were present. Ventricular Bigeminy was present.  By Dr. Ronnie Derby note Echo June 22, 2018  Segmental wall motion abnormality - (mid inferolateral and basal inferolateral)  Overall normal left ventricular systolic function, ejection fraction 55 to 60%  Normal right ventricular systolic function    Laboratory Data:  High Sensitivity Troponin:  No results for input(s): TROPONINIHS in the last 720 hours.   Cardiac EnzymesNo results for input(s): TROPONINI in the last 168 hours. No results for input(s): TROPIPOC in the last 168 hours.  Chemistry Recent Labs  Lab 10/03/18 1908  NA 135  K 4.4  CL 108  CO2 20*  GLUCOSE 269*  BUN 20  CREATININE 1.04  CALCIUM 9.2  GFRNONAA >60  GFRAA >60  ANIONGAP 7    No results for input(s): PROT, ALBUMIN, AST, ALT, ALKPHOS,  BILITOT in the last 168 hours. Hematology Recent Labs  Lab 10/03/18 1908  WBC 8.8  RBC 4.76  HGB 14.5  HCT 43.7  MCV 91.8  MCH 30.5  MCHC 33.2  RDW 12.8  PLT 238   BNPNo results for input(s): BNP, PROBNP in the last 168 hours.  DDimer No results for input(s): DDIMER in the last 168 hours.   Radiology/Studies:  No results found.  Assessment and Plan:   1. Symptomatic bradycardia 2. H/o palpitations, symptomatic PACs, blocked PACs by outpatient record     I do not appreciate any documented arrhythmia otherwise (outside of his STEMI arrest in 2014)  Monitor her wore reports low HR 25 (unknown time/what this was) and fast of 174(this was a 4beat NSVT) discusses Mobitz I  I suspect he has symptomatic bradycardia, sinus node dysfunction and some AV disease as well, he is also bothered by palpitations said to be PACs with marked bradycardia when treated with BB  His lopressor has worn out and he remains with SB 40's with Mobitz I, at times 2:1 conduction I discussed with the patient that I think he may benefit from PPM Discussed rational, implant procedure, potential risks and benefits. Discussed will review with Dr. Junius Finner for final recommendations, he is agreeable to PPM if needed.    3. CAD     I do not appreciate any anginal sounding symptoms             For questions or updates, please contact [place] Please consult [email or web address] for contact info under     Signed, Baldwin Jamaica, PA-C  10/04/2018 12:00 PM  I have seen and examined this patient with Tommye Standard.  Agree with above, note added to reflect my findings.  On exam, iRRR, bradycardic, no murmurs, lungs clear. Patient with intermittent heart block with junctional escape. Symptoms of weakness and fatigue. Will plan for pacemaekr implant.  Jay Hanson has presented today for surgery, with the diagnosis of jucntional bradycardia.  The various methods of treatment have been discussed with the  patient and family. After consideration of risks, benefits and other options for treatment, the patient has consented to  Procedure(s): Pacemaker implant as a surgical intervention .  Risks include but not limited to bleeding, tamponade, infection, pneumothorax, among others. The patient's history has been reviewed, patient examined, no change in status, stable for surgery.  I have reviewed the patient's chart and labs.  Questions were answered to the patient's satisfaction.    Will Curt Bears, MD 10/04/2018 2:41 PM      Will M. Camnitz MD 10/04/2018 2:40 PM

## 2018-10-04 NOTE — Progress Notes (Addendum)
Pt's lowest HR was 33-40, BP 136/74 mmHg, remained in the normal range while he was sleeping. I notified Bodenhiemer,NP on-call provider. Awaiting for response. No order received. Pt is asymptomatic.EKG showed 2nd  Degree AVB , Mobitz I on 12 leads EKG.   Will continue to monitor.   Kathryne Hitch, RN

## 2018-10-04 NOTE — Progress Notes (Addendum)
Pt transferred from ED to 4E02 , on arrival at 01: 57 am, appeared alert and oriented x 4, present and no acute distress noted. His  EKG sinus brady cardia with junctional rhythm and occasionally dropped beat with unsustained slow HR between 37-50. BP 148/75 mmHg. Able to walk from strecher to bed without assistance, no dizziness.   CHG bath given, equipments instructions in room given, call bell with in reach. CCMD called with 2nd person verified pt's EKG.  Continue to monitor.  Starsky Nanna "Tie" Myrtice Lauth, RN

## 2018-10-04 NOTE — Progress Notes (Signed)
Inpatient Diabetes Program Recommendations  AACE/ADA: New Consensus Statement on Inpatient Glycemic Control (2015)  Target Ranges:  Prepandial:   less than 140 mg/dL      Peak postprandial:   less than 180 mg/dL (1-2 hours)      Critically ill patients:  140 - 180 mg/dL   Results for Jay Hanson, Jay Hanson (MRN [id number]) as of 10/04/2018 09:08  Ref. Range 10/03/2018 20:23 10/04/2018 07:00  Glucose-Capillary Latest Ref Range: 70 - 99 mg/dL 207 (H) 189 (H)  2 units NOVOLOG    Results for [name], [name] (MRN [id number]) as of 10/04/2018 09:08  Ref. Range 10/04/2018 03:52  Hemoglobin A1C Latest Ref Range: 4.8 - 5.6 % 9.0 (H)  (211 mg/dl)    Admit with: Bradycardia/ first-degree AV block  History: DM, STEMI, VF Arrest  Home DM Meds: Novolog (unknown dose)       Levemir 36 units BID       Janumet 50/500 (unknown dose)  Current Orders: Novolog Sensitive Correction Scale/ SSI (0-9 units) TID AC + HS    PCP: Dr. Irven Shelling     MD- Note patient is supposed to be taking Levemir 36 units BID at home.  CBG 189 mg/dl this AM.  Please consider starting a weight based dose of Levemir today:  Levemir 15 units Daily (0.15 units/kg dosing based on weight of 108 kg)    --Will follow patient during hospitalization--  Wyn Quaker RN, MSN, CDE Diabetes Coordinator Inpatient Glycemic Control Team Team Pager: [phone] (8a-5p)

## 2018-10-04 NOTE — ED Provider Notes (Signed)
[facility] EMERGENCY DEPARTMENT Provider Note   CSN: [id number] Arrival date & time: 10/03/18  1810    History   Chief Complaint Chief Complaint  Patient presents with  . Weakness  . Nausea    HPI Jay Hanson is a 68 y.o. male with history of CAD, type II DM, no palpitations presenting to the ED complaining of generalized weakness and nausea.  Patient reports that he was seen in May at [facility] for palpitations for which he was placed on a ZIO patch and was started on metoprolol at follow-up cardiology visit.  Patient reports he was prescribed 12.5 mg 3 times daily of metoprolol but reports that it does not last long enough so he takes an extra 1 daily.  He reports he can feel the metoprolol wearing off and states he becomes weak, short of breath, feels his "heart skipping", and becomes nauseous.  He reports today he had a sustained episode of weakness and nausea which prompted him to come to the ED.  He denies any chest pain, cough, fever, chills, abdominal pain, vomiting, diarrhea, or any other complaints.     The history is provided by the patient.    Past Medical History:  Diagnosis Date  . Coronary artery disease   . MI (myocardial infarction) [facility])     Patient Active Problem List   Diagnosis Date Noted  . Bradycardia 06/26/2018  . Premature atrial beats 06/26/2018  . Allergies 06/22/2018  . Heart block 06/22/2018  . Chest pain 12/12/2015  . Diabetes type 2, controlled ([place]) 12/12/2015  . History of MI (myocardial infarction) 12/12/2015  . Tobacco dipper 12/12/2015    Past Surgical History:  Procedure Laterality Date  . CARDIAC SURGERY          Home Medications    Prior to Admission medications   Medication Sig Start Date End Date Taking? Authorizing Provider  aspirin EC 81 MG tablet Take 81 mg by mouth daily.    [provider]  atorvastatin (LIPITOR) 80 MG tablet Take by mouth. 06/22/18   [provider]   busPIRone (BUSPAR) 10 MG tablet Take 10 mg by mouth 2 (two) times daily. 07/28/17   [provider]  cetirizine (ZYRTEC) 10 MG tablet Take by mouth. 06/10/18 07/10/18  [provider]  clopidogrel (PLAVIX) 75 MG tablet Take 37.5 mg by mouth daily.  06/15/17   [provider]  dextromethorphan-guaiFENesin (MUCINEX DM) 30-600 MG 12hr tablet Take 1 tablet by mouth 2 (two) times daily.    [provider]  fluticasone (FLONASE) 50 MCG/ACT nasal spray Place 1 spray into both nostrils daily as needed for allergies. 06/26/17   [provider]  fluticasone (FLONASE) 50 MCG/ACT nasal spray 1 spray by Each Nare route daily. 06/23/18 06/23/19  [provider]  glucose blood (ONE TOUCH ULTRA TEST) test strip USE TO CHECK GLUCOSE 4 TIMES DAILY 06/11/18   [provider]  insulin aspart (NOVOLOG) 100 UNIT/ML injection Inject into the skin.    [provider]  insulin detemir (LEVEMIR) 100 UNIT/ML injection Inject 36 Units into the skin 2 (two) times daily.    [provider]  metoCLOPramide (REGLAN) 10 MG tablet Take 10 mg by mouth daily. 06/29/17   [provider]  metoprolol tartrate (LOPRESSOR) 25 MG tablet Take by mouth. 06/26/18 07/26/18  [provider]  sitaGLIPtin-metformin (JANUMET) 50-500 MG tablet Take by mouth.    [provider]  Vitamin D, Ergocalciferol, (DRISDOL) 1.25 MG (  50000 UT) CAPS capsule Take by mouth.    [provider]    Family History No family history on file.  Social History Social History   Tobacco Use  . Smoking status: Former Research scientist (life sciences)  . Smokeless tobacco: Never Used  Substance Use Topics  . Alcohol use: Never    Frequency: Never  . Drug use: Never     Allergies   Patient has no known allergies.   Review of Systems Review of Systems  Constitutional: Negative for chills and fever.  HENT: Negative for ear pain and sore throat.   Eyes: Negative for pain and visual  disturbance.  Respiratory: Positive for shortness of breath. Negative for cough.   Cardiovascular: Positive for palpitations. Negative for chest pain.  Gastrointestinal: Positive for nausea. Negative for abdominal pain and vomiting.  Genitourinary: Negative for dysuria and hematuria.  Musculoskeletal: Negative for arthralgias and back pain.  Skin: Negative for color change and rash.  Neurological: Positive for weakness. Negative for seizures and syncope.  All other systems reviewed and are negative.    Physical Exam Updated Vital Signs BP 126/78   Pulse (!) 46   Temp 98.2 F (36.8 C) (Oral)   Resp 16   Ht 5\' 8"  (1.727 m)   Wt 107 kg   SpO2 99%   BMI 35.88 kg/m   Physical Exam Vitals signs and nursing note reviewed.  Constitutional:      General: He is not in acute distress.    Appearance: Normal appearance. He is obese. He is not ill-appearing, toxic-appearing or diaphoretic.  HENT:     Head: Normocephalic and atraumatic.     Nose: Nose normal. No congestion or rhinorrhea.     Mouth/Throat:     Mouth: Mucous membranes are moist.     Pharynx: Oropharynx is clear. No oropharyngeal exudate or posterior oropharyngeal erythema.  Eyes:     Extraocular Movements: Extraocular movements intact.     Pupils: Pupils are equal, round, and reactive to light.  Neck:     Musculoskeletal: Normal range of motion and neck supple. No neck rigidity or muscular tenderness.  Cardiovascular:     Rate and Rhythm: Regular rhythm. Bradycardia present.     Pulses: Normal pulses.     Heart sounds: Normal heart sounds. No murmur. No friction rub. No gallop.   Pulmonary:     Effort: Pulmonary effort is normal. No respiratory distress.     Breath sounds: Normal breath sounds. No stridor. No wheezing, rhonchi or rales.  Abdominal:     General: Abdomen is flat. There is no distension.     Palpations: Abdomen is soft.     Tenderness: There is no abdominal tenderness. There is no guarding or rebound.   Musculoskeletal: Normal range of motion.        General: No swelling, tenderness, deformity or signs of injury.  Skin:    General: Skin is warm and dry.  Neurological:     General: No focal deficit present.     Mental Status: He is alert and oriented to person, place, and time. Mental status is at baseline.     Cranial Nerves: No cranial nerve deficit.     Sensory: No sensory deficit.     Motor: No weakness.  Psychiatric:        Mood and Affect: Mood normal.        Behavior: Behavior normal.      ED Treatments / Results  Labs (all labs ordered are listed, but  only abnormal results are displayed) Labs Reviewed  BASIC METABOLIC PANEL - Abnormal; Notable for the following components:      Result Value   CO2 20 (*)    Glucose, Bld 269 (*)    All other components within normal limits  URINALYSIS, ROUTINE W REFLEX MICROSCOPIC - Abnormal; Notable for the following components:   Glucose, UA >=500 (*)    All other components within normal limits  CBG MONITORING, ED - Abnormal; Notable for the following components:   Glucose-Capillary 207 (*)    All other components within normal limits  SARS CORONAVIRUS 2 (HOSPITAL ORDER, [place] [facility])  CBC    EKG EKG Interpretation  Date/Time:  Thursday October 03 2018 19:10:34 EDT Ventricular Rate:  41 PR Interval:    QRS Duration: 74 QT Interval:  440 QTC Calculation: 363 R Axis:   51 Text Interpretation:  Junctional bradycardia with Premature supraventricular complexes in a pattern of bigeminy Low voltage QRS Abnormal ECG No acute changes Confirmed by Varney Biles ([id number]) on 10/03/2018 9:28:47 PM   Radiology No results found.  Procedures Procedures (including critical care time)  Medications Ordered in ED Medications  sodium chloride flush (NS) 0.9 % injection 3 mL (has no administration in time range)  sodium chloride 0.9 % bolus 500 mL (0 mLs Intravenous Stopped 10/04/18 0016)     Initial Impression /  Assessment and Plan / ED Course  I have reviewed the triage vital signs and the nursing notes.  Pertinent labs & imaging results that were available during my care of the patient were reviewed by me and considered in my medical decision making (see chart for details).        Jay Hanson is a 68 y.o. male with history of CAD, type II DM, no palpitations presenting to the ED complaining of generalized weakness and nausea.  On arrival to the ED, patient is bradycardic in the 40s and hemodynamically stable.  Heart rate dropped to as low as 39 while in the ED.  It is difficult to determine at this time whether patients bradycardia is secondary to medication effect or underlying bradyarrhythmia.  EKG demonstrates junctional rhythm with a few conducted P waves.  BMP significant for hyperglycemia to 269.  CBC is unremarkable.  Cardiology was consulted and recommended washout of metoprolol and reassessment in the morning if he remains bradycardic.  Hospitalist was contacted for admission.  They will admit patient to their service for symptomatic bradycardia.   Final Clinical Impressions(s) / ED Diagnoses   Final diagnoses:  Bradycardia      Candie Chroman, MD 10/04/18 Pricilla Loveless, MD 10/04/18 [phone]

## 2018-10-04 NOTE — Progress Notes (Signed)
PROGRESS NOTE  Jay Hanson FYB:[id number] DOB: [birth year] DOA: 10/03/2018 PCP: Irven Shelling, MD   LOS: 0 days   Brief narrative: Jay Hanson is a 68 y.o. male with PMH of hypertension, type 2 diabetes, CAD with history of STEMI and VF arrest in 2014, bradycardia/first-degree AV block, PACs who follows up with cardiology at [facility].  For last 4 months, patient has been experiencing skipped heartbeats.  He was seen in [facility] in May for palpitation, ZIO patch was placed.  He followed up with cardiologist at [facility].  Per previous documentations, his bradycardia secondary to blocked PACs.  He was subsequently started on metoprolol 12.5 mg 3 times a day despite which he continued to have symptoms.  In June he called his cardiologist and was suggested to increase it to 4 times a day.  Despite this, patient has intermittent episodes of skipped heartbeat.  On 7/30, immediately after getting out of his truck, patient felt skipped beats and significant dizziness.  He also felt weak and nauseous and hence presented to ED.  In the ED, his heart rate was in 40s and 50s.  Blood pressure was stable.  EKG showed junctional bradycardia with heart rate at 41. CBC unremarkable.  BUN/creatinine 20/1. ED provider discussed with cardiologist Dr. Arnell Sieving.  He suggested to hold metoprolol and admit to monitor in telemetry.   Patient was admitted under hospitalist service. Patient continues to be bradycardic this morning even after several hours of withholding metoprolol.  Cardiology has been consulted.  Subjective: Patient was seen and examined this morning.  Lying down in bed.  Not in distress.  No new symptoms at rest.  No dizziness at rest.  No feeling of skipped beat in the hospital.  Telemetry reviewed.  Continues to remain bradycardic.  Assessment/Plan:  Principal Problem:   Bradycardia Active Problems:   CAD (coronary artery disease)   HLD (hyperlipidemia)   Type 2 diabetes mellitus (HCC)  Near syncope - bradycardia, significant dizziness, lightheadedness -Patient has a history of bradycardia, first-degree AV block, PACs.   -He is followed by cardiology at [facility]. Apparently, blocked PACs were thought to be causing his bradycardia.  He was started on increasing dose of metoprolol.  No improvement in symptoms. -Because of significant bradycardia, metoprolol has now been held.  Cardiology here has been consulted.   CAD, history of STEMI and VF arrest in 2014 status post PCI -Continue home aspirin, Plavix, statin -Hold metoprolol at this time given bradycardia  Hyperlipidemia -Continue home Lipitor  Type 2 diabetes -Hemoglobin A1c 9. -PTA, patient was on Levemir 40 units twice daily, Januvia 100 mg daily. -Currently he is on insulin sliding scale with Accu-Cheks. -I will resume him on Levemir at half dose - 20 units twice daily from this morning. -Urinalysis negative for proteinuria.  Mobility: Encourage ambulation Diet: Cardiac/diabetic diet DVT prophylaxis:  Lovenox subcu Code Status:   Code Status: Full Code  Family Communication:  Expected Discharge:  Pending cardiology evaluation planning  Consultants:  Cardiology  Procedures:  None  Antimicrobials:  Anti-infectives (From admission, onward)   None      Infusions:    Scheduled Meds: . aspirin EC  81 mg Oral Daily  . atorvastatin  80 mg Oral q1800  . clopidogrel  75 mg Oral Daily  . enoxaparin (LOVENOX) injection  40 mg Subcutaneous Daily  . insulin aspart  0-5 Units Subcutaneous QHS  . insulin aspart  0-9 Units Subcutaneous TID WC  . sodium chloride flush  3 mL  Intravenous Once    PRN meds: acetaminophen **OR** acetaminophen   Objective: Vitals:   10/04/18 0200 10/04/18 0500  BP: (!) 148/75 136/74  Pulse: (!) 36 (!) 33  Resp: 19 (!) 21  Temp:  (!) 97.5 F (36.4 C)  SpO2: 99% 100%    Intake/Output Summary (Last 24 hours) at 10/04/2018 1053 Last data filed at 10/04/2018 0502 Gross per  24 hour  Intake 500 ml  Output 1000 ml  Net -500 ml   Filed Weights   10/03/18 2121 10/04/18 0157  Weight: 107 kg 108.7 kg   Weight change:  Body mass index is 36.45 kg/m.   Physical Exam: General exam: Appears calm and comfortable.  Lying on bed.  Not in distress Skin: No rashes, lesions or ulcers. HEENT: Atraumatic, normocephalic, supple neck, no obvious bleeding Lungs: Clear to auscultation bilaterally CVS: Bradycardic, sinus rhythm, no murmur GI/Abd soft, nondistended, nontender, bowel sound present CNS: Alert, awake, oriented x3 Psychiatry: Mood appropriate Extremities: No pedal edema, no calf tenderness  Data Review: I have personally reviewed the laboratory data and studies available.  Recent Labs  Lab 10/03/18 1908  WBC 8.8  HGB 14.5  HCT 43.7  MCV 91.8  PLT 238   Recent Labs  Lab 10/03/18 1908  NA 135  K 4.4  CL 108  CO2 20*  GLUCOSE 269*  BUN 20  CREATININE 1.04  CALCIUM 9.2    Terrilee Croak, MD  [facility] 10/04/2018

## 2018-10-05 ENCOUNTER — Inpatient Hospital Stay (HOSPITAL_COMMUNITY): Payer: PPO

## 2018-10-05 DIAGNOSIS — I441 Atrioventricular block, second degree: Principal | ICD-10-CM

## 2018-10-05 LAB — GLUCOSE, CAPILLARY
Glucose-Capillary: 213 mg/dL — ABNORMAL HIGH (ref 70–99)
Glucose-Capillary: 187 mg/dL — ABNORMAL HIGH (ref 70–99)

## 2018-10-05 MED ORDER — ATORVASTATIN CALCIUM 80 MG PO TABS: 80.0000 mg | ORAL_TABLET | Freq: Every day | ORAL | 0 refills | Status: DC

## 2018-10-05 MED ORDER — INSULIN DETEMIR 100 UNIT/ML ~~LOC~~ SOLN
30.0000 [IU] | Freq: Two times a day (BID) | SUBCUTANEOUS | Status: DC
  Administered 2018-10-05: 30 [IU] via SUBCUTANEOUS
  Filled 2018-10-05 (×2): qty 0.3

## 2018-10-05 NOTE — Discharge Instructions (Signed)
° ° °  Supplemental Discharge Instructions for  Pacemaker  Patients  Activity No heavy lifting or vigorous activity with your left/right arm for 6 to 8 weeks.  Do not raise your left/right arm above your head for one week.  Gradually raise your affected arm as drawn below.             10/08/2018                   10/09/2018                   10/10/2018                10/11/2018 __  NO DRIVING until your wound check visit .  WOUND CARE - Keep the wound area clean and dry.  Do not get this area wet, no showers until cleared to at your wound check visit . - The tape/steri-strips on your wound will fall off; do not pull them off.  No bandage is needed on the site.  DO  NOT apply any creams, oils, or ointments to the wound area. - If you notice any drainage or discharge from the wound, any swelling or bruising at the site, or you develop a fever > 101? F after you are discharged home, call the office at once.  Special Instructions - You are still able to use cellular telephones; use the ear opposite the side where you have your pacemaker/defibrillator.  Avoid carrying your cellular phone near your device. - When traveling through airports, show security personnel your identification card to avoid being screened in the metal detectors.  Ask the security personnel to use the hand wand. - Avoid arc welding equipment, MRI testing (magnetic resonance imaging), TENS units (transcutaneous nerve stimulators).  Call the office for questions about other devices. - Avoid electrical appliances that are in poor condition or are not properly grounded. - Microwave ovens are safe to be near or to operate.

## 2018-10-05 NOTE — Progress Notes (Signed)
   Progress Note   Subjective   Doing well today, the patient denies CP or SOB.  No new concerns  Inpatient Medications    Scheduled Meds: . aspirin EC  81 mg Oral Daily  . atorvastatin  80 mg Oral q1800  . clopidogrel  75 mg Oral Daily  . insulin aspart  0-5 Units Subcutaneous QHS  . insulin aspart  0-9 Units Subcutaneous TID WC  . insulin detemir  30 Units Subcutaneous BID  . metoprolol tartrate  25 mg Oral BID  . sodium chloride flush  3 mL Intravenous Once  . sodium chloride flush  3 mL Intravenous Q12H   Continuous Infusions:  PRN Meds: acetaminophen **OR** acetaminophen, ondansetron (ZOFRAN) IV   Vital Signs    Vitals:   10/04/18 2003 10/05/18 0001 10/05/18 0322 10/05/18 0806  BP: 140/76 133/82 131/72 130/75  Pulse: 66 (!) 59 60 60  Resp: 18 17 (!) 22   Temp: 97.8 F (36.6 C) 97.7 F (36.5 C) 98.2 F (36.8 C) 98.1 F (36.7 C)  TempSrc: Oral Oral Oral Oral  SpO2: 98% 97% 94% 98%  Weight:      Height:        Intake/Output Summary (Last 24 hours) at 10/05/2018 1127 Last data filed at 10/05/2018 1120 Gross per 24 hour  Intake 540 ml  Output 1000 ml  Net -460 ml   Filed Weights   10/03/18 2121 10/04/18 0157  Weight: 107 kg 108.7 kg    Telemetry    Sinus rhythm - Personally Reviewed  Physical Exam   GEN- The patient is well appearing, alert and oriented x 3 today.   Head- normocephalic, atraumatic Eyes-  Sclera clear, conjunctiva pink Ears- hearing intact Oropharynx- clear Neck- supple, Lungs-  normal work of breathing Heart- Regular rate and rhythm  GI- soft, NT, ND, + BS Extremities- no clubbing, cyanosis, or edema  MS- no significant deformity or atrophy Skin- pacemaker site is without hematoma   Labs    Chemistry Recent Labs  Lab 10/03/18 1908  NA 135  K 4.4  CL 108  CO2 20*  GLUCOSE 269*  BUN 20  CREATININE 1.04  CALCIUM 9.2  GFRNONAA >60  GFRAA >60  ANIONGAP 7     Hematology Recent Labs  Lab 10/03/18 1908  WBC 8.8   RBC 4.76  HGB 14.5  HCT 43.7  MCV 91.8  MCH 30.5  MCHC 33.2  RDW 12.8  PLT 238     Patient Profile:   Jay Hanson is a 68 y.o. male with a hx of HTN, DM, CAD (prior STEMI w/ PCI in 2014 at [facility], associated with cardiac arrest) who is being seen today for the evaluation of bradycardia at the request of Dr. Horton Chin.  Assessment & Plan    1.  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 Please call with questions.   Thompson Grayer MD, [facility] 10/05/2018 11:27 AM

## 2018-10-05 NOTE — Discharge Summary (Signed)
Physician Discharge Summary  Jay Hanson ACZ:[id number] DOB: 1950-03-08 DOA: 10/03/2018  PCP: Irven Shelling, MD  Admit date: 10/03/2018 Discharge date: 10/05/2018  Admitted From: Home Discharge disposition: Home   Code Status: Full Code   Recommendations for Outpatient Follow-Up:   1. Follow-up with EP as an outpatient  Discharge Diagnosis:   Principal Problem:   Bradycardia Active Problems:   CAD (coronary artery disease)   HLD (hyperlipidemia)   Type 2 diabetes mellitus ([place])    History of Present Illness / Brief narrative:  Jay Hanson a 68 y.o.malewith PMH of hypertension, type 2 diabetes, CAD with history of STEMI and VF arrest in 2014, bradycardia/first-degree AV block, PACs who follows up with cardiology at [facility].  For last 4 months, patient has been experiencing skipped heartbeats.  He was seen in [facility] in May for palpitation, ZIO patch was placed.  He followed up with cardiologist at [facility].  Per previous documentations, his bradycardia secondary to blocked PACs.  He was subsequently started on metoprolol 12.5 mg 3 times a day despite which he continued to have symptoms.  In June he called his cardiologist and was suggested to increase it to 4 times a day.  Despite this, patient has intermittent episodes of skipped heartbeat.  On 7/30, immediately after getting out of his truck, patient felt skipped beats and significant dizziness.  He also felt weak and nauseous and hence presented to ED.  In the ED, his heart rate was in 40s and 50s.  Blood pressure was stable.  EKG showed junctional bradycardia with heart rate at 41. CBC unremarkable.  BUN/creatinine 20/1. ED provider discussed with cardiologist Dr. Arnell Sieving.  He suggested to hold metoprolol and admit to monitor in telemetry.   Patient was admitted under hospitalist service. Patient continues to be bradycardic this morning even after several hours of withholding metoprolol.  Cardiology was  consulted. Underwent permanent pacemaker placement yesterday.   Subjective:  Patient was seen and examined this morning.  Pleasant elderly male.  Feels much better after pacemaker insertion.  Wants to go home today.  Hospital Course:  [place] I second degree AV block/ symptomatic sinus bradycardia -Presented with near syncopal episode, palpitations.  Has history of palpitations, symptomatic PACs, blocked PACs by outpatient record -Cardiology consult appreciated.  Underwent permanent pacemaker implantation on 7/31. -Pacemaker site looks good.  Cleared by cardiology for discharge to home today. -Continue routine wound care at home.  Follow-up with EP as an outpatient.     CAD, history of STEMI and VF arrest in 2014 status post PCI -Continue home aspirin, Plavix, statin and metoprolol.  Hyperlipidemia -Continue home Lipitor  Type 2 diabetes -Hemoglobin A1c 9. -PTA, patient was on Levemir 40 units twice daily, Januvia 100 mg daily. Resume the same post discharge. -Urinalysis negative for proteinuria.  Stable for discharge to home today.  Discharge Exam:   Vitals:   10/05/18 0001 10/05/18 0322 10/05/18 0806 10/05/18 1100  BP: 133/82 131/72 130/75 121/83  Pulse: (!) 59 60 60 70  Resp: 17 (!) 22  (!) 29  Temp: 97.7 F (36.5 C) 98.2 F (36.8 C) 98.1 F (36.7 C) 98 F (36.7 C)  TempSrc: Oral Oral Oral Oral  SpO2: 97% 94% 98% 95%  Weight:      Height:        Body mass index is 36.45 kg/m.  General exam: Appears calm and comfortable.  Skin: No rashes, lesions or ulcers. HEENT: Atraumatic, normocephalic, supple neck, no obvious bleeding Lungs:  Clear to auscultation bilaterally.  Pacemaker insertion site in the left lateral chest wall intact. CVS: Regular rate and rhythm, no murmur GI/Abd soft, nontender, nondistended, bowel sound present CNS: Alert, awake oriented x3 Psychiatry: Mood appropriate Extremities: No pedal edema no calf tenderness  Discharge Instructions:   Wound care: Routine care pacemaker insertion site.  Follow-up Information    Constance Haw, MD Follow up.   Specialty: Cardiology Why: 01/16/2019 @ 2:15PM Contact information: [address] [place] [address] [phone]        Shirley Friar, PA-C Follow up.   Specialty: Physician Assistant Why: 10/16/2018 @ 9:00AM, wound check visit Contact information: [place] [address] [phone]          Allergies as of 10/05/2018   No Known Allergies     Medication List    TAKE these medications   aspirin EC 81 MG tablet Take 81 mg by mouth daily.   atorvastatin 80 MG tablet Commonly known as: LIPITOR Take 1 tablet (80 mg total) by mouth daily at 6 PM. What changed:   how much to take  when to take this   cetirizine 10 MG tablet Commonly known as: ZYRTEC Take 10 mg by mouth daily.   clopidogrel 75 MG tablet Commonly known as: PLAVIX Take 37.5 mg by mouth daily.   fluticasone 50 MCG/ACT nasal spray Commonly known as: FLONASE Place 1 spray into both nostrils daily as needed for allergies.   Levemir 100 UNIT/ML injection Generic drug: insulin detemir Inject 40 Units into the skin 2 (two) times daily.   metoprolol tartrate 25 MG tablet Commonly known as: LOPRESSOR Take 12.5 mg by mouth 4 (four) times daily.   ONE TOUCH ULTRA TEST test strip Generic drug: glucose blood USE TO CHECK GLUCOSE 4 TIMES DAILY   pantoprazole 40 MG tablet Commonly known as: PROTONIX Take 40 mg by mouth daily.   sitaGLIPtin 100 MG tablet Commonly known as: JANUVIA Take 100 mg by mouth daily.   Vitamin D3 125 MCG (5000 UT) Tabs Take 5,000 Units by mouth daily.       Time coordinating discharge: 35 minutes  The results of significant diagnostics from this hospitalization (including imaging, microbiology, ancillary and laboratory) are listed below for reference.    Procedures and Diagnostic Studies:   No results  found.   Labs:   Basic Metabolic Panel: Recent Labs  Lab 10/03/18 1908  NA 135  K 4.4  CL 108  CO2 20*  GLUCOSE 269*  BUN 20  CREATININE 1.04  CALCIUM 9.2   GFR Estimated Creatinine Clearance: 81.3 mL/min (by C-G formula based on SCr of 1.04 mg/dL). Liver Function Tests: No results for input(s): AST, ALT, ALKPHOS, BILITOT, PROT, ALBUMIN in the last 168 hours. No results for input(s): LIPASE, AMYLASE in the last 168 hours. No results for input(s): AMMONIA in the last 168 hours. Coagulation profile No results for input(s): INR, PROTIME in the last 168 hours.  CBC: Recent Labs  Lab 10/03/18 1908  WBC 8.8  HGB 14.5  HCT 43.7  MCV 91.8  PLT 238   Cardiac Enzymes: No results for input(s): CKTOTAL, CKMB, CKMBINDEX, TROPONINI in the last 168 hours. BNP: Invalid input(s): POCBNP CBG: Recent Labs  Lab 10/04/18 1118 10/04/18 1743 10/04/18 2110 10/05/18 0608 10/05/18 1133  GLUCAP 210* 168* 199* 213* 187*   D-Dimer No results for input(s): DDIMER in the last 72 hours. Hgb A1c Recent Labs  10/04/18 0352  HGBA1C 9.0*   Lipid Profile No results for input(s): CHOL, HDL, LDLCALC, TRIG, CHOLHDL, LDLDIRECT in the last 72 hours. Thyroid function studies Recent Labs    10/04/18 0352  TSH 2.270   Anemia work up No results for input(s): VITAMINB12, FOLATE, FERRITIN, TIBC, IRON, RETICCTPCT in the last 72 hours. Microbiology Recent Results (from the past 240 hour(s))  SARS Coronavirus 2 (CEPHEID - Performed in [place] hospital lab), Hosp Order     Status: None   Collection Time: 10/04/18 12:22 AM   Specimen: Nasopharyngeal Swab  Result Value Ref Range Status   SARS Coronavirus 2 NEGATIVE NEGATIVE Final    Comment: (NOTE) If result is NEGATIVE SARS-CoV-2 target nucleic acids are NOT DETECTED. The SARS-CoV-2 RNA is generally detectable in upper and lower  respiratory specimens during the acute phase of infection. The lowest  concentration of SARS-CoV-2 viral  copies this assay can detect is 250  copies / mL. A negative result does not preclude SARS-CoV-2 infection  and should not be used as the sole basis for treatment or other  patient management decisions.  A negative result may occur with  improper specimen collection / handling, submission of specimen other  than nasopharyngeal swab, presence of viral mutation(s) within the  areas targeted by this assay, and inadequate number of viral copies  (<250 copies / mL). A negative result must be combined with clinical  observations, patient history, and epidemiological information. If result is POSITIVE SARS-CoV-2 target nucleic acids are DETECTED. The SARS-CoV-2 RNA is generally detectable in upper and lower  respiratory specimens dur ing the acute phase of infection.  Positive  results are indicative of active infection with SARS-CoV-2.  Clinical  correlation with patient history and other diagnostic information is  necessary to determine patient infection status.  Positive results do  not rule out bacterial infection or co-infection with other viruses. If result is PRESUMPTIVE POSTIVE SARS-CoV-2 nucleic acids MAY BE PRESENT.   A presumptive positive result was obtained on the submitted specimen  and confirmed on repeat testing.  While 2019 novel coronavirus  (SARS-CoV-2) nucleic acids may be present in the submitted sample  additional confirmatory testing may be necessary for epidemiological  and / or clinical management purposes  to differentiate between  SARS-CoV-2 and other Sarbecovirus currently known to infect humans.  If clinically indicated additional testing with an alternate test  methodology [phone]) is advised. The SARS-CoV-2 RNA is generally  detectable in upper and lower respiratory sp ecimens during the acute  phase of infection. The expected result is Negative. Fact Sheet for Patients:  StrictlyIdeas.no Fact Sheet for Healthcare Providers:  BankingDealers.co.za This test is not yet approved or cleared by the [place] FDA and has been authorized for detection and/or diagnosis of SARS-CoV-2 by FDA under an Emergency Use Authorization (EUA).  This EUA will remain in effect (meaning this test can be used) for the duration of the COVID-19 declaration under Section 564(b)(1) of the Act, 21 U.S.C. section 360bbb-3(b)(1), unless the authorization is terminated or revoked sooner. Performed at [facility], [place] [address] [place], [place] [address]   Surgical PCR screen     Status: None   Collection Time: 10/04/18  3:46 PM   Specimen: Nasal Mucosa; Nasal Swab  Result Value Ref Range Status   MRSA, PCR NEGATIVE NEGATIVE Final   Staphylococcus aureus NEGATIVE NEGATIVE Final    Comment: (NOTE) The Xpert SA Assay (FDA approved for NASAL specimens in patients 22 years  of age and older), is one component of a comprehensive surveillance program. It is not intended to diagnose infection nor to guide or monitor treatment. Performed at [facility], [place] [address], [place], [place] [address]     Signed: Terrilee Croak  [facility] 10/05/2018, 1:22 PM

## 2018-10-07 ENCOUNTER — Encounter (HOSPITAL_COMMUNITY): Payer: Self-pay | Admitting: Cardiology

## 2018-10-07 MED FILL — Lidocaine HCl Local Inj 1%: INTRAMUSCULAR | Qty: 60 | Status: AC

## 2018-10-16 ENCOUNTER — Other Ambulatory Visit: Payer: Self-pay

## 2018-10-16 ENCOUNTER — Ambulatory Visit (INDEPENDENT_AMBULATORY_CARE_PROVIDER_SITE_OTHER): Payer: PPO | Admitting: Student

## 2018-10-16 DIAGNOSIS — I459 Conduction disorder, unspecified: Secondary | ICD-10-CM | POA: Diagnosis not present

## 2018-10-16 LAB — CUP PACEART INCLINIC DEVICE CHECK
Battery Remaining Longevity: 123 mo
Battery Voltage: 3.07 V
Brady Statistic RA Percent Paced: 91 %
Brady Statistic RV Percent Paced: 99 %
Date Time Interrogation Session: 20200812093450
Implantable Lead Implant Date: 20200731
Implantable Lead Implant Date: 20200731
Implantable Lead Location: 753859
Implantable Lead Location: 753860
Implantable Pulse Generator Implant Date: 20200731
Lead Channel Impedance Value: 475 Ohm
Lead Channel Impedance Value: 725 Ohm
Lead Channel Pacing Threshold Amplitude: 0.375 V
Lead Channel Pacing Threshold Amplitude: 0.5 V
Lead Channel Pacing Threshold Pulse Width: 0.4 ms
Lead Channel Pacing Threshold Pulse Width: 0.5 ms
Lead Channel Sensing Intrinsic Amplitude: 12 mV
Lead Channel Sensing Intrinsic Amplitude: 4.8 mV
Lead Channel Setting Pacing Amplitude: 0.625
Lead Channel Setting Pacing Amplitude: 1.5 V
Lead Channel Setting Pacing Pulse Width: 0.5 ms
Lead Channel Setting Sensing Sensitivity: 2 mV
Pulse Gen Model: 2272
Pulse Gen Serial Number: 9152674

## 2018-10-16 NOTE — Progress Notes (Signed)
Wound check appointment. Steri-strips removed. Wound without redness or edema. Incision edges approximated, wound well healed. Normal device function. Thresholds, sensing, and impedances consistent with implant measurements. Device auto capture programmed in RA and RV. Histogram distribution appropriate for patient and level of activity. 1 Mode switch reported but no EGM. No high ventricular rates noted. Patient educated about wound care, arm mobility, lifting restrictions. ROV in 3 months with Dr. Curt Bears physician. Next remote 01/06/2019   Beryle Beams" [place], PA-C 10/16/2018 9:36 AM

## 2018-10-16 NOTE — Patient Instructions (Signed)
If moving your monitor to your bedside table does not stop the yellow light, please call [employer] tech support at [phone]

## 2019-01-02 DIAGNOSIS — E038 Other specified hypothyroidism: Secondary | ICD-10-CM | POA: Diagnosis not present

## 2019-01-02 DIAGNOSIS — D518 Other vitamin B12 deficiency anemias: Secondary | ICD-10-CM | POA: Diagnosis not present

## 2019-01-02 DIAGNOSIS — D519 Vitamin B12 deficiency anemia, unspecified: Secondary | ICD-10-CM | POA: Diagnosis not present

## 2019-01-02 DIAGNOSIS — E782 Mixed hyperlipidemia: Secondary | ICD-10-CM | POA: Diagnosis not present

## 2019-01-02 DIAGNOSIS — I10 Essential (primary) hypertension: Secondary | ICD-10-CM | POA: Diagnosis not present

## 2019-01-02 DIAGNOSIS — E559 Vitamin D deficiency, unspecified: Secondary | ICD-10-CM | POA: Diagnosis not present

## 2019-01-02 DIAGNOSIS — E119 Type 2 diabetes mellitus without complications: Secondary | ICD-10-CM | POA: Diagnosis not present

## 2019-01-06 ENCOUNTER — Encounter: Payer: PPO | Admitting: *Deleted

## 2019-01-06 ENCOUNTER — Ambulatory Visit (INDEPENDENT_AMBULATORY_CARE_PROVIDER_SITE_OTHER): Payer: PPO | Admitting: Cardiology

## 2019-01-06 ENCOUNTER — Encounter: Payer: Self-pay | Admitting: Cardiology

## 2019-01-06 ENCOUNTER — Other Ambulatory Visit: Payer: Self-pay

## 2019-01-06 VITALS — BP 126/74 | HR 68 | Ht 68.0 in | Wt 240.0 lb

## 2019-01-06 DIAGNOSIS — I459 Conduction disorder, unspecified: Secondary | ICD-10-CM | POA: Diagnosis not present

## 2019-01-06 MED ORDER — CARVEDILOL 6.25 MG PO TABS: 6.2500 mg | ORAL_TABLET | Freq: Two times a day (BID) | ORAL | 6 refills | Status: DC

## 2019-01-06 NOTE — Patient Instructions (Addendum)
Medication Instructions:  Your physician has recommended you make the following change in your medication:  1. STOP Metoprolol Tartrate (Lopressor)  2. START Carvedilol 6.25 mg twice a day  *If you need a refill on your cardiac medications before your next appointment, please call your pharmacy*  Labwork: None ordered  Testing/Procedures: None ordered  Follow-Up: Remote monitoring is used to monitor your Pacemaker or ICD from home. This monitoring reduces the number of office visits required to check your device to one time per year. It allows [place] to keep an eye on the functioning of your device to ensure it is working properly. You are scheduled for a device check from home on 04/15/2019. You may send your transmission at any time that day. If you have a wireless device, the transmission will be sent automatically. After your physician reviews your transmission, you will receive a postcard with your next transmission date.  At [facility], you and your health needs are our priority.  As part of our continuing mission to provide you with exceptional heart care, we have created designated Provider Care Teams.  These Care Teams include your primary Cardiologist (physician) and Advanced Practice Providers (APPs -  Physician Assistants and Nurse Practitioners) who all work together to provide you with the care you need, when you need it.  You will need a follow up appointment in 9 months.  Please call our office 2 months in advance to schedule this appointment.  You may see Dr Curt Bears or one of the following Advanced Practice Providers on your designated Care Team:    Chanetta Marshall, NP  Tommye Standard, PA-C  Oda Kilts, [place]  Thank you for choosing [facility]!!   Trinidad Curet, RN [phone]  Any Other Special Instructions Will Be Listed Below (If Applicable).  Carvedilol tablets What is this medicine? CARVEDILOL (KAR ve dil ol) is a beta-blocker. Beta-blockers reduce the  workload on the heart and help it to beat more regularly. This medicine is used to treat high blood pressure and heart failure. This medicine may be used for other purposes; ask your health care provider or pharmacist if you have questions. COMMON BRAND NAME(S): Coreg What should I tell my health care provider before I take this medicine? They need to know if you have any of these conditions:  circulation problems  diabetes  history of heart attack or heart disease  liver disease  lung or breathing disease, like asthma or emphysema  pheochromocytoma  slow or irregular heartbeat  thyroid disease  an unusual or allergic reaction to carvedilol, other beta-blockers, medicines, foods, dyes, or preservatives  pregnant or trying to get pregnant  breast-feeding How should I use this medicine? Take this medicine by mouth with a glass of water. Follow the directions on the prescription label. It is best to take the tablets with food. Take your doses at regular intervals. Do not take your medicine more often than directed. Do not stop taking except on the advice of your doctor or health care professional. Talk to your pediatrician regarding the use of this medicine in children. Special care may be needed. Overdosage: If you think you have taken too much of this medicine contact a poison control center or emergency room at once. NOTE: This medicine is only for you. Do not share this medicine with others. What if I miss a dose? If you miss a dose, take it as soon as you can. If it is almost time for your next dose, take only  that dose. Do not take double or extra doses. What may interact with this medicine? This medicine may interact with the following medications:  certain medicines for blood pressure, heart disease, irregular heart beat  certain medicines for depression, like fluoxetine or paroxetine  certain medicines for diabetes, like glipizide or glyburide  cimetidine  clonidine   cyclosporine  digoxin  MAOIs like Carbex, Eldepryl, Marplan, Nardil, and Parnate  reserpine  rifampin This list may not describe all possible interactions. Give your health care provider a list of all the medicines, herbs, non-prescription drugs, or dietary supplements you use. Also tell them if you smoke, drink alcohol, or use illegal drugs. Some items may interact with your medicine. What should I watch for while using this medicine? Check your heart rate and blood pressure regularly while you are taking this medicine. Ask your doctor or health care professional what your heart rate and blood pressure should be, and when you should contact him or her. Do not stop taking this medicine suddenly. This could lead to serious heart-related effects. Contact your doctor or health care professional if you have difficulty breathing while taking this drug. Check your weight daily. Ask your doctor or health care professional when you should notify him/her of any weight gain. You may get drowsy or dizzy. Do not drive, use machinery, or do anything that requires mental alertness until you know how this medicine affects you. To reduce the risk of dizzy or fainting spells, do not sit or stand up quickly. Alcohol can make you more drowsy, and increase flushing and rapid heartbeats. Avoid alcoholic drinks. This medicine may increase blood sugar. Ask your healthcare provider if changes in diet or medicines are needed if you have diabetes. If you are going to have surgery, tell your doctor or health care professional that you are taking this medicine. What side effects may I notice from receiving this medicine? Side effects that you should report to your doctor or health care professional as soon as possible:  allergic reactions like skin rash, itching or hives, swelling of the face, lips, or tongue  breathing problems  dark urine  irregular heartbeat   signs and symptoms of high blood sugar such as  being more thirsty or hungry or having to urinate more than normal. You may also feel very tired or have blurry vision.  swollen legs or ankles  vomiting  yellowing of the eyes or skin Side effects that usually do not require medical attention (report to your doctor or health care professional if they continue or are bothersome):  change in sex drive or performance  diarrhea  dry eyes (especially if wearing contact lenses)  dry, itching skin  headache  nausea  unusually tired This list may not describe all possible side effects. Call your doctor for medical advice about side effects. You may report side effects to FDA at 1-800-FDA-1088. Where should I keep my medicine? Keep out of the reach of children. Store at room temperature below 30 degrees C (86 degrees F). Protect from moisture. Keep container tightly closed. Throw away any unused medicine after the expiration date. NOTE: This sheet is a summary. It may not cover all possible information. If you have questions about this medicine, talk to your doctor, pharmacist, or health care provider.  2020 Elsevier/Gold Standard (2017-12-12 09:13:57)

## 2019-01-06 NOTE — Progress Notes (Signed)
Electrophysiology Office Note   Date:  01/06/2019   ID:  Jay Hanson, DOB Jul 04, 1950, MRN [id number]  PCP:  Bonnita Nasuti, MD  Cardiologist: Dwyane Dee Primary Electrophysiologist:  Alaycia Eardley Jay Leeds, MD    Chief Complaint: Pacemaker   History of Present Illness: Jay Hanson is a 68 y.o. male who is being seen today for the evaluation of pacemaker at the request of Hague, Rosalyn Charters, MD. Presenting today for electrophysiology evaluation.  He has a history of hypertension, diabetes, coronary artery disease status post STEMI with PCI in 2014 associated with cardiac arrest.  He presented to [facility] July 2020 with weakness, near syncope, and palpitations.  He was found to be in junctional bradycardia with heart rates in the 40s to 50s.  He is now status post [place] dual-chamber pacemaker implanted 10/04/2018.  Today, he denies symptoms of palpitations, chest pain, shortness of breath, orthopnea, PND, lower extremity edema, claudication, dizziness, presyncope, syncope, bleeding, or neurologic sequela. The patient is tolerating medications without difficulties.    Past Medical History:  Diagnosis Date  . Bradycardia 10/04/2018  . Coronary artery disease   . Diabetes mellitus without complication ([place])   . MI (myocardial infarction) [facility])    Past Surgical History:  Procedure Laterality Date  . CARDIAC SURGERY    . COLONOSCOPY    . PACEMAKER IMPLANT N/A 10/04/2018   Procedure: PACEMAKER IMPLANT;  Surgeon: Constance Haw, MD;  Location: [place] [facility];  Service: Cardiovascular;  Laterality: N/A;     Current Outpatient Medications  Medication Sig Dispense Refill  . aspirin EC 81 MG tablet Take 81 mg by mouth daily.    . Cholecalciferol (VITAMIN D3) 125 MCG (5000 UT) TABS Take 5,000 Units by mouth daily.    . clopidogrel (PLAVIX) 75 MG tablet Take 37.5 mg by mouth daily.   0  . fluticasone (FLONASE) 50 MCG/ACT nasal spray Place 1 spray into both nostrils daily as  needed for allergies.  2  . glucose blood (ONE TOUCH ULTRA TEST) test strip USE TO CHECK GLUCOSE 4 TIMES DAILY    . insulin detemir (LEVEMIR) 100 UNIT/ML injection Inject 40 Units into the skin 2 (two) times daily.     . pantoprazole (PROTONIX) 40 MG tablet Take 40 mg by mouth daily.    . sitaGLIPtin (JANUVIA) 100 MG tablet Take 100 mg by mouth daily.    Marland Kitchen atorvastatin (LIPITOR) 80 MG tablet Take 1 tablet (80 mg total) by mouth daily at 6 PM. 30 tablet 0  . carvedilol (COREG) 6.25 MG tablet Take 1 tablet (6.25 mg total) by mouth 2 (two) times daily. 60 tablet 6  . cetirizine (ZYRTEC) 10 MG tablet Take 10 mg by mouth daily.      No current facility-administered medications for this visit.     Allergies:   Patient has no known allergies.   Social History:  The patient  reports that he has quit smoking. He has never used smokeless tobacco. He reports that he does not drink alcohol or use drugs.   Family History:  The patient's family history includes Heart disease in his brother.    ROS:  Please see the history of present illness.   Otherwise, review of systems is positive for none.   All other systems are reviewed and negative.    PHYSICAL EXAM: VS:  BP 126/74   Pulse 68   Ht 5\' 8"  (1.727 m)   Wt 240 lb (108.9 kg)   SpO2  97%   BMI 36.49 kg/m  , BMI Body mass index is 36.49 kg/m. GEN: Well nourished, well developed, in no acute distress  HEENT: normal  Neck: no JVD, carotid bruits, or masses Cardiac: RRR; no murmurs, rubs, or gallops,no edema  Respiratory:  clear to auscultation bilaterally, normal work of breathing GI: soft, nontender, nondistended, + BS MS: no deformity or atrophy  Skin: warm and dry Neuro:  Strength and sensation are intact Psych: euthymic mood, full affect  EKG:  EKG is ordered today. Personal review of the ekg ordered  shows AV paced with atrial bigeminy  Recent Labs: 10/03/2018: BUN 20; Creatinine, Ser 1.04; Hemoglobin 14.5; Platelets 238; Potassium  4.4; Sodium 135 10/04/2018: TSH 2.270    Lipid Panel  No results found for: CHOL, TRIG, HDL, CHOLHDL, VLDL, LDLCALC, LDLDIRECT   Wt Readings from Last 3 Encounters:  01/06/19 240 lb (108.9 kg)  10/04/18 239 lb 11.2 oz (108.7 kg)      Other studies Reviewed: Additional studies/ records that were reviewed today include: TTE 06/2016  Review of the above records today demonstrates:   Overall normal left ventricular systolic function, ejection fraction 55 to 60%  Normal right ventricular systolic function    ASSESSMENT AND PLAN:  1.  Second-degree AV block: Status post pacemaker implanted 10/04/2018.  Device functioning appropriately.  He is having some atrial bigeminy.  We Jay Hanson stop his metoprolol and put him on 6.25 mg of carvedilol twice a day.  2.  Coronary artery disease: Status post PCI in 2014.  No current chest pain.  3.  Hypertension:well controlled    Current medicines are reviewed at length with the patient today.   The patient does not have concerns regarding his medicines.  The following changes were made today: Toprol, start carvedilol  Labs/ tests ordered today include:  Orders Placed This Encounter  Procedures  . EKG 12-Lead   Case discussed with PCP  Disposition:   FU with Jay Hanson 9 months  Signed, Jay Hanson Jay Leeds, MD  01/06/2019 3:15 PM     [place] [address] [phone] (office) [phone] (fax)

## 2019-02-10 DIAGNOSIS — U071 COVID-19: Secondary | ICD-10-CM | POA: Diagnosis not present

## 2019-02-10 DIAGNOSIS — Z20828 Contact with and (suspected) exposure to other viral communicable diseases: Secondary | ICD-10-CM | POA: Diagnosis not present

## 2019-04-04 DIAGNOSIS — Z0000 Encounter for general adult medical examination without abnormal findings: Secondary | ICD-10-CM | POA: Diagnosis not present

## 2019-04-04 DIAGNOSIS — D518 Other vitamin B12 deficiency anemias: Secondary | ICD-10-CM | POA: Diagnosis not present

## 2019-04-04 DIAGNOSIS — E782 Mixed hyperlipidemia: Secondary | ICD-10-CM | POA: Diagnosis not present

## 2019-04-04 DIAGNOSIS — E038 Other specified hypothyroidism: Secondary | ICD-10-CM | POA: Diagnosis not present

## 2019-04-04 DIAGNOSIS — E559 Vitamin D deficiency, unspecified: Secondary | ICD-10-CM | POA: Diagnosis not present

## 2019-04-04 DIAGNOSIS — Z125 Encounter for screening for malignant neoplasm of prostate: Secondary | ICD-10-CM | POA: Diagnosis not present

## 2019-04-04 DIAGNOSIS — E785 Hyperlipidemia, unspecified: Secondary | ICD-10-CM | POA: Diagnosis not present

## 2019-04-04 DIAGNOSIS — Z1211 Encounter for screening for malignant neoplasm of colon: Secondary | ICD-10-CM | POA: Diagnosis not present

## 2019-04-04 DIAGNOSIS — E1165 Type 2 diabetes mellitus with hyperglycemia: Secondary | ICD-10-CM | POA: Diagnosis not present

## 2019-04-04 DIAGNOSIS — E119 Type 2 diabetes mellitus without complications: Secondary | ICD-10-CM | POA: Diagnosis not present

## 2019-04-04 DIAGNOSIS — I10 Essential (primary) hypertension: Secondary | ICD-10-CM | POA: Diagnosis not present

## 2019-04-07 DIAGNOSIS — R221 Localized swelling, mass and lump, neck: Secondary | ICD-10-CM | POA: Diagnosis not present

## 2019-04-07 DIAGNOSIS — E042 Nontoxic multinodular goiter: Secondary | ICD-10-CM | POA: Diagnosis not present

## 2019-04-07 DIAGNOSIS — I714 Abdominal aortic aneurysm, without rupture: Secondary | ICD-10-CM | POA: Diagnosis not present

## 2019-04-07 DIAGNOSIS — I700 Atherosclerosis of aorta: Secondary | ICD-10-CM | POA: Diagnosis not present

## 2019-04-07 DIAGNOSIS — D492 Neoplasm of unspecified behavior of bone, soft tissue, and skin: Secondary | ICD-10-CM | POA: Diagnosis not present

## 2019-04-09 DIAGNOSIS — I6523 Occlusion and stenosis of bilateral carotid arteries: Secondary | ICD-10-CM | POA: Diagnosis not present

## 2019-04-09 DIAGNOSIS — R0989 Other specified symptoms and signs involving the circulatory and respiratory systems: Secondary | ICD-10-CM | POA: Diagnosis not present

## 2019-04-09 DIAGNOSIS — R590 Localized enlarged lymph nodes: Secondary | ICD-10-CM | POA: Diagnosis not present

## 2019-04-15 ENCOUNTER — Ambulatory Visit: Payer: PPO

## 2019-04-16 ENCOUNTER — Telehealth: Payer: Self-pay

## 2019-04-16 LAB — CUP PACEART REMOTE DEVICE CHECK
Battery Remaining Longevity: 125 mo
Battery Remaining Percentage: 95.5 %
Battery Voltage: 3.01 V
Brady Statistic AP VP Percent: 65 %
Brady Statistic AP VS Percent: 2.4 %
Brady Statistic AS VP Percent: 25 %
Brady Statistic AS VS Percent: 7.7 %
Brady Statistic RA Percent Paced: 66 %
Brady Statistic RV Percent Paced: 90 %
Date Time Interrogation Session: 20210210095500
Implantable Lead Implant Date: 20200731
Implantable Lead Implant Date: 20200731
Implantable Lead Location: 753859
Implantable Lead Location: 753860
Implantable Pulse Generator Implant Date: 20200731
Lead Channel Impedance Value: 460 Ohm
Lead Channel Impedance Value: 710 Ohm
Lead Channel Pacing Threshold Amplitude: 0.5 V
Lead Channel Pacing Threshold Amplitude: 1.625 V
Lead Channel Pacing Threshold Pulse Width: 0.4 ms
Lead Channel Pacing Threshold Pulse Width: 0.5 ms
Lead Channel Sensing Intrinsic Amplitude: 12 mV
Lead Channel Sensing Intrinsic Amplitude: 5 mV
Lead Channel Setting Pacing Amplitude: 0.75 V
Lead Channel Setting Pacing Amplitude: 3.125
Lead Channel Setting Pacing Pulse Width: 0.5 ms
Lead Channel Setting Sensing Sensitivity: 2 mV
Pulse Gen Model: 2272
Pulse Gen Serial Number: 9152674

## 2019-04-16 NOTE — Telephone Encounter (Signed)
Spoke with patient to remind of missed remote transmission 

## 2019-05-09 DIAGNOSIS — R110 Nausea: Secondary | ICD-10-CM | POA: Diagnosis not present

## 2019-05-21 DIAGNOSIS — I2510 Atherosclerotic heart disease of native coronary artery without angina pectoris: Secondary | ICD-10-CM | POA: Diagnosis not present

## 2019-05-21 DIAGNOSIS — R1011 Right upper quadrant pain: Secondary | ICD-10-CM | POA: Diagnosis not present

## 2019-05-21 DIAGNOSIS — R110 Nausea: Secondary | ICD-10-CM | POA: Diagnosis not present

## 2019-05-21 DIAGNOSIS — E119 Type 2 diabetes mellitus without complications: Secondary | ICD-10-CM | POA: Diagnosis not present

## 2019-05-26 DIAGNOSIS — Z950 Presence of cardiac pacemaker: Secondary | ICD-10-CM | POA: Insufficient documentation

## 2019-05-26 DIAGNOSIS — I491 Atrial premature depolarization: Secondary | ICD-10-CM | POA: Diagnosis not present

## 2019-05-26 DIAGNOSIS — Z01810 Encounter for preprocedural cardiovascular examination: Secondary | ICD-10-CM | POA: Diagnosis not present

## 2019-05-26 DIAGNOSIS — I441 Atrioventricular block, second degree: Secondary | ICD-10-CM | POA: Insufficient documentation

## 2019-05-26 DIAGNOSIS — I2510 Atherosclerotic heart disease of native coronary artery without angina pectoris: Secondary | ICD-10-CM | POA: Diagnosis not present

## 2019-05-26 DIAGNOSIS — Z955 Presence of coronary angioplasty implant and graft: Secondary | ICD-10-CM

## 2019-05-26 DIAGNOSIS — I252 Old myocardial infarction: Secondary | ICD-10-CM

## 2019-05-26 DIAGNOSIS — I454 Nonspecific intraventricular block: Secondary | ICD-10-CM | POA: Diagnosis not present

## 2019-05-26 HISTORY — DX: Old myocardial infarction: I25.2

## 2019-05-26 HISTORY — DX: Presence of coronary angioplasty implant and graft: Z95.5

## 2019-05-26 HISTORY — DX: Presence of cardiac pacemaker: Z95.0

## 2019-05-26 HISTORY — DX: Atrioventricular block, second degree: I44.1

## 2019-05-30 DIAGNOSIS — B974 Respiratory syncytial virus as the cause of diseases classified elsewhere: Secondary | ICD-10-CM | POA: Diagnosis not present

## 2019-05-30 DIAGNOSIS — J101 Influenza due to other identified influenza virus with other respiratory manifestations: Secondary | ICD-10-CM | POA: Diagnosis not present

## 2019-05-30 DIAGNOSIS — U071 COVID-19: Secondary | ICD-10-CM | POA: Diagnosis not present

## 2019-05-30 DIAGNOSIS — Z20828 Contact with and (suspected) exposure to other viral communicable diseases: Secondary | ICD-10-CM | POA: Diagnosis not present

## 2019-06-06 DIAGNOSIS — I2510 Atherosclerotic heart disease of native coronary artery without angina pectoris: Secondary | ICD-10-CM | POA: Diagnosis not present

## 2019-06-06 DIAGNOSIS — K811 Chronic cholecystitis: Secondary | ICD-10-CM | POA: Diagnosis not present

## 2019-06-06 DIAGNOSIS — K819 Cholecystitis, unspecified: Secondary | ICD-10-CM | POA: Diagnosis not present

## 2019-06-06 DIAGNOSIS — Z8719 Personal history of other diseases of the digestive system: Secondary | ICD-10-CM | POA: Diagnosis not present

## 2019-06-06 DIAGNOSIS — E119 Type 2 diabetes mellitus without complications: Secondary | ICD-10-CM | POA: Diagnosis not present

## 2019-06-06 DIAGNOSIS — Z794 Long term (current) use of insulin: Secondary | ICD-10-CM | POA: Diagnosis not present

## 2019-07-04 DIAGNOSIS — E1165 Type 2 diabetes mellitus with hyperglycemia: Secondary | ICD-10-CM | POA: Diagnosis not present

## 2019-07-04 DIAGNOSIS — E559 Vitamin D deficiency, unspecified: Secondary | ICD-10-CM | POA: Diagnosis not present

## 2019-07-04 DIAGNOSIS — E291 Testicular hypofunction: Secondary | ICD-10-CM | POA: Diagnosis not present

## 2019-07-04 DIAGNOSIS — E785 Hyperlipidemia, unspecified: Secondary | ICD-10-CM | POA: Diagnosis not present

## 2019-07-15 ENCOUNTER — Ambulatory Visit (INDEPENDENT_AMBULATORY_CARE_PROVIDER_SITE_OTHER): Payer: PPO | Admitting: *Deleted

## 2019-07-15 DIAGNOSIS — I459 Conduction disorder, unspecified: Secondary | ICD-10-CM

## 2019-07-16 ENCOUNTER — Telehealth: Payer: Self-pay

## 2019-07-16 NOTE — Telephone Encounter (Signed)
Spoke with patient to remind of missed remote transmission 

## 2019-07-21 LAB — CUP PACEART REMOTE DEVICE CHECK
Battery Remaining Longevity: 121 mo
Battery Remaining Percentage: 95.5 %
Battery Voltage: 3.01 V
Brady Statistic AP VP Percent: 76 %
Brady Statistic AP VS Percent: 1.6 %
Brady Statistic AS VP Percent: 17 %
Brady Statistic AS VS Percent: 4.9 %
Brady Statistic RA Percent Paced: 77 %
Brady Statistic RV Percent Paced: 94 %
Date Time Interrogation Session: 20210511141055
Implantable Lead Implant Date: 20200731
Implantable Lead Implant Date: 20200731
Implantable Lead Location: 753859
Implantable Lead Location: 753860
Implantable Pulse Generator Implant Date: 20200731
Lead Channel Impedance Value: 480 Ohm
Lead Channel Impedance Value: 730 Ohm
Lead Channel Pacing Threshold Amplitude: 0.5 V
Lead Channel Pacing Threshold Amplitude: 0.5 V
Lead Channel Pacing Threshold Pulse Width: 0.4 ms
Lead Channel Pacing Threshold Pulse Width: 0.5 ms
Lead Channel Sensing Intrinsic Amplitude: 12 mV
Lead Channel Sensing Intrinsic Amplitude: 5 mV
Lead Channel Setting Pacing Amplitude: 0.75 V
Lead Channel Setting Pacing Amplitude: 1.5 V
Lead Channel Setting Pacing Pulse Width: 0.5 ms
Lead Channel Setting Sensing Sensitivity: 2 mV
Pulse Gen Model: 2272
Pulse Gen Serial Number: 9152674

## 2019-07-21 NOTE — Progress Notes (Signed)
Remote pacemaker transmission.   

## 2019-10-03 DIAGNOSIS — I10 Essential (primary) hypertension: Secondary | ICD-10-CM | POA: Diagnosis not present

## 2019-10-03 DIAGNOSIS — E559 Vitamin D deficiency, unspecified: Secondary | ICD-10-CM | POA: Diagnosis not present

## 2019-10-03 DIAGNOSIS — J329 Chronic sinusitis, unspecified: Secondary | ICD-10-CM | POA: Diagnosis not present

## 2019-10-03 DIAGNOSIS — E291 Testicular hypofunction: Secondary | ICD-10-CM | POA: Diagnosis not present

## 2019-10-03 DIAGNOSIS — E1165 Type 2 diabetes mellitus with hyperglycemia: Secondary | ICD-10-CM | POA: Diagnosis not present

## 2019-10-03 DIAGNOSIS — D511 Vitamin B12 deficiency anemia due to selective vitamin B12 malabsorption with proteinuria: Secondary | ICD-10-CM | POA: Diagnosis not present

## 2019-10-03 DIAGNOSIS — E782 Mixed hyperlipidemia: Secondary | ICD-10-CM | POA: Diagnosis not present

## 2019-10-03 DIAGNOSIS — E038 Other specified hypothyroidism: Secondary | ICD-10-CM | POA: Diagnosis not present

## 2019-10-03 DIAGNOSIS — I2510 Atherosclerotic heart disease of native coronary artery without angina pectoris: Secondary | ICD-10-CM | POA: Diagnosis not present

## 2019-10-03 DIAGNOSIS — J309 Allergic rhinitis, unspecified: Secondary | ICD-10-CM | POA: Diagnosis not present

## 2019-10-03 DIAGNOSIS — E785 Hyperlipidemia, unspecified: Secondary | ICD-10-CM | POA: Diagnosis not present

## 2019-10-03 DIAGNOSIS — E7849 Other hyperlipidemia: Secondary | ICD-10-CM | POA: Diagnosis not present

## 2019-10-03 DIAGNOSIS — E119 Type 2 diabetes mellitus without complications: Secondary | ICD-10-CM | POA: Diagnosis not present

## 2019-10-03 DIAGNOSIS — D518 Other vitamin B12 deficiency anemias: Secondary | ICD-10-CM | POA: Diagnosis not present

## 2019-10-06 ENCOUNTER — Encounter: Payer: PPO | Admitting: Cardiology

## 2019-10-14 ENCOUNTER — Ambulatory Visit: Payer: PPO

## 2019-11-06 ENCOUNTER — Ambulatory Visit (INDEPENDENT_AMBULATORY_CARE_PROVIDER_SITE_OTHER): Payer: PPO | Admitting: Cardiology

## 2019-11-06 ENCOUNTER — Other Ambulatory Visit: Payer: Self-pay

## 2019-11-06 ENCOUNTER — Encounter: Payer: Self-pay | Admitting: Cardiology

## 2019-11-06 VITALS — BP 118/78 | HR 65 | Ht 68.0 in | Wt 254.0 lb

## 2019-11-06 DIAGNOSIS — I441 Atrioventricular block, second degree: Secondary | ICD-10-CM | POA: Diagnosis not present

## 2019-11-06 LAB — CUP PACEART INCLINIC DEVICE CHECK
Battery Remaining Longevity: 121 mo
Battery Voltage: 3.01 V
Brady Statistic RA Percent Paced: 81 %
Brady Statistic RV Percent Paced: 95 %
Date Time Interrogation Session: 20210902170113
Implantable Lead Implant Date: 20200731
Implantable Lead Implant Date: 20200731
Implantable Lead Location: 753859
Implantable Lead Location: 753860
Implantable Pulse Generator Implant Date: 20200731
Lead Channel Impedance Value: 462.5 Ohm
Lead Channel Impedance Value: 725 Ohm
Lead Channel Pacing Threshold Amplitude: 0.375 V
Lead Channel Pacing Threshold Amplitude: 0.5 V
Lead Channel Pacing Threshold Pulse Width: 0.4 ms
Lead Channel Pacing Threshold Pulse Width: 0.5 ms
Lead Channel Sensing Intrinsic Amplitude: 12 mV
Lead Channel Sensing Intrinsic Amplitude: 3 mV
Lead Channel Setting Pacing Amplitude: 0.625
Lead Channel Setting Pacing Amplitude: 1.375
Lead Channel Setting Pacing Pulse Width: 0.5 ms
Lead Channel Setting Sensing Sensitivity: 2 mV
Pulse Gen Model: 2272
Pulse Gen Serial Number: 9152674

## 2019-11-06 NOTE — Progress Notes (Signed)
Electrophysiology Office Note   Date:  11/06/2019   ID:  Aldin Drees, DOB Jun 27, 1950, MRN [id number]  PCP:  Bonnita Nasuti, MD  Cardiologist: Dwyane Dee Primary Electrophysiologist:  Darcey Demma [name], MD    Chief Complaint: Pacemaker   History of Present Illness: Jay Hanson is a 69 y.o. male who is being seen today for the evaluation of pacemaker at the request of Hague, Rosalyn Charters, MD. Presenting today for electrophysiology evaluation.  He has a history of hypertension, diabetes, coronary artery disease status post STEMI with PCI in 2014 associated with cardiac arrest.  He presented to [facility] July 2020 with weakness, near syncope, and palpitations.  He was found to be in junctional bradycardia with heart rates in the 40s to 50s.  He is now status post [place] dual-chamber pacemaker implanted 10/04/2018.  Today, denies symptoms of palpitations, chest pain, shortness of breath, orthopnea, PND, lower extremity edema, claudication, dizziness, presyncope, syncope, bleeding, or neurologic sequela. The patient is tolerating medications without difficulties.  Currently he is feeling well.  He has no chest pain or shortness of breath and is able to do all his daily activities.  He continues to exercise and work for a few hours each day.  Past Medical History:  Diagnosis Date  . Bradycardia 10/04/2018  . Coronary artery disease   . Diabetes mellitus without complication ([place])   . MI (myocardial infarction) [facility])    Past Surgical History:  Procedure Laterality Date  . CARDIAC SURGERY    . COLONOSCOPY    . PACEMAKER IMPLANT N/A 10/04/2018   Procedure: PACEMAKER IMPLANT;  Surgeon: Constance Haw, MD;  Location: [place] [facility];  Service: Cardiovascular;  Laterality: N/A;     Current Outpatient Medications  Medication Sig Dispense Refill  . aspirin EC 81 MG tablet Take 81 mg by mouth daily.    Marland Kitchen atorvastatin (LIPITOR) 80 MG tablet Take 1 tablet (80 mg total) by mouth  daily at 6 PM. 30 tablet 0  . carvedilol (COREG) 6.25 MG tablet Take 3.125 mg by mouth in the morning, at noon, in the evening, and at bedtime.    . cetirizine (ZYRTEC) 10 MG tablet Take 10 mg by mouth daily.     . Cholecalciferol (VITAMIN D3) 125 MCG (5000 UT) TABS Take 5,000 Units by mouth daily.    . clopidogrel (PLAVIX) 75 MG tablet Take 75 mg by mouth daily.    . fluticasone (FLONASE) 50 MCG/ACT nasal spray Place 1 spray into both nostrils daily as needed for allergies.  2  . glucose blood (ONE TOUCH ULTRA TEST) test strip USE TO CHECK GLUCOSE 4 TIMES DAILY    . insulin aspart protamine- aspart (NOVOLOG MIX 70/30) (70-30) 100 UNIT/ML injection Inject 0-100 Units into the skin as needed (for blood sugar).    . insulin detemir (LEVEMIR) 100 UNIT/ML injection Inject 60 Units into the skin 2 (two) times daily.     . pantoprazole (PROTONIX) 40 MG tablet Take 40 mg by mouth daily.    . sitaGLIPtin (JANUVIA) 100 MG tablet Take 100 mg by mouth daily.     No current facility-administered medications for this visit.    Allergies:   Azithromycin and Canagliflozin   Social History:  The patient  reports that he has quit smoking. He has never used smokeless tobacco. He reports that he does not drink alcohol and does not use drugs.   Family History:  The patient's family history includes Heart disease in his  brother.   ROS:  Please see the history of present illness.   Otherwise, review of systems is positive for none.   All other systems are reviewed and negative.   PHYSICAL EXAM: VS:  BP 118/78   Pulse 65   Ht 5\' 8"  (1.727 m)   Wt 254 lb (115.2 kg)   SpO2 97%   BMI 38.62 kg/m  , BMI Body mass index is 38.62 kg/m. GEN: Well nourished, well developed, in no acute distress  HEENT: normal  Neck: no JVD, carotid bruits, or masses Cardiac: RRR; no murmurs, rubs, or gallops,no edema  Respiratory:  clear to auscultation bilaterally, normal work of breathing GI: soft, nontender, nondistended, +  BS MS: no deformity or atrophy  Skin: warm and dry Neuro:  Strength and sensation are intact Psych: euthymic mood, full affect  EKG:  EKG is ordered today. Personal review of the ekg ordered shows sinus rhythm, ventricular paced, rate 61  Recent Labs: No results found for requested labs within last 8760 hours.    Lipid Panel  No results found for: CHOL, TRIG, HDL, CHOLHDL, VLDL, LDLCALC, LDLDIRECT   Wt Readings from Last 3 Encounters:  11/06/19 254 lb (115.2 kg)  01/06/19 240 lb (108.9 kg)  10/04/18 239 lb 11.2 oz (108.7 kg)      Other studies Reviewed: Additional studies/ records that were reviewed today include: TTE 06/2016  Review of the above records today demonstrates:   Overall normal left ventricular systolic function, ejection fraction 55 to 60%  Normal right ventricular systolic function    ASSESSMENT AND PLAN:  1.  Second-degree AV block: Status post Saint Jude dual-chamber pacemaker implanted 10/04/2018.  Device functioning appropriately.  No changes at this time.    2.  Coronary artery disease: Status post PCI in 2014.  No current chest pain.  3.  Hypertension: Currently well controlled    Current medicines are reviewed at length with the patient today.   The patient does not have concerns regarding his medicines.  The following changes were made today: None  Labs/ tests ordered today include:  Orders Placed This Encounter  Procedures  . EKG 12-Lead   Disposition:   FU with Charlett Merkle 12 months  Signed, Coty Student [name], MD  11/06/2019 3:52 PM     [place] [address] [phone] (office) [phone] (fax)

## 2019-11-07 ENCOUNTER — Telehealth: Payer: Self-pay

## 2019-11-07 NOTE — Telephone Encounter (Signed)
Pt seen in clinic on 9/3, device not communicating remotely, last communication was 7/30.  Patient was instructed to ensure monitor plugged in and send manual transmission yesterday following visit.  Verified today, transmission not received.  Attempted to call to assist with manual transmission.  No answer on Home answering machine/ no VM.  Left message on mobile VM requesting callback.

## 2019-11-09 LAB — CUP PACEART REMOTE DEVICE CHECK
Battery Remaining Longevity: 119 mo
Battery Remaining Percentage: 95.5 %
Battery Voltage: 3.01 V
Brady Statistic AP VP Percent: 80 %
Brady Statistic AP VS Percent: 1.4 %
Brady Statistic AS VP Percent: 15 %
Brady Statistic AS VS Percent: 4 %
Brady Statistic RA Percent Paced: 80 %
Brady Statistic RV Percent Paced: 95 %
Date Time Interrogation Session: 20210810044102
Implantable Lead Implant Date: 20200731
Implantable Lead Implant Date: 20200731
Implantable Lead Location: 753859
Implantable Lead Location: 753860
Implantable Pulse Generator Implant Date: 20200731
Lead Channel Impedance Value: 480 Ohm
Lead Channel Impedance Value: 730 Ohm
Lead Channel Pacing Threshold Amplitude: 0.375 V
Lead Channel Pacing Threshold Amplitude: 0.5 V
Lead Channel Pacing Threshold Pulse Width: 0.4 ms
Lead Channel Pacing Threshold Pulse Width: 0.5 ms
Lead Channel Sensing Intrinsic Amplitude: 12 mV
Lead Channel Sensing Intrinsic Amplitude: 5 mV
Lead Channel Setting Pacing Amplitude: 0.75 V
Lead Channel Setting Pacing Amplitude: 1.375
Lead Channel Setting Pacing Pulse Width: 0.5 ms
Lead Channel Setting Sensing Sensitivity: 2 mV
Pulse Gen Model: 2272
Pulse Gen Serial Number: 9152674

## 2019-11-11 NOTE — Telephone Encounter (Signed)
Remote transmissions now coming through.  Spoke with pt, he states he unplegged monitor and plugged it back in.  Advised next transmission due 01/13/20

## 2019-12-04 DIAGNOSIS — D511 Vitamin B12 deficiency anemia due to selective vitamin B12 malabsorption with proteinuria: Secondary | ICD-10-CM | POA: Diagnosis not present

## 2019-12-04 DIAGNOSIS — E559 Vitamin D deficiency, unspecified: Secondary | ICD-10-CM | POA: Diagnosis not present

## 2019-12-04 DIAGNOSIS — E119 Type 2 diabetes mellitus without complications: Secondary | ICD-10-CM | POA: Diagnosis not present

## 2019-12-04 DIAGNOSIS — D518 Other vitamin B12 deficiency anemias: Secondary | ICD-10-CM | POA: Diagnosis not present

## 2019-12-04 DIAGNOSIS — I10 Essential (primary) hypertension: Secondary | ICD-10-CM | POA: Diagnosis not present

## 2019-12-04 DIAGNOSIS — I2510 Atherosclerotic heart disease of native coronary artery without angina pectoris: Secondary | ICD-10-CM | POA: Diagnosis not present

## 2019-12-04 DIAGNOSIS — E785 Hyperlipidemia, unspecified: Secondary | ICD-10-CM | POA: Diagnosis not present

## 2019-12-04 DIAGNOSIS — E782 Mixed hyperlipidemia: Secondary | ICD-10-CM | POA: Diagnosis not present

## 2019-12-04 DIAGNOSIS — E1165 Type 2 diabetes mellitus with hyperglycemia: Secondary | ICD-10-CM | POA: Diagnosis not present

## 2019-12-04 DIAGNOSIS — E038 Other specified hypothyroidism: Secondary | ICD-10-CM | POA: Diagnosis not present

## 2019-12-31 DIAGNOSIS — E782 Mixed hyperlipidemia: Secondary | ICD-10-CM | POA: Diagnosis not present

## 2019-12-31 DIAGNOSIS — E1165 Type 2 diabetes mellitus with hyperglycemia: Secondary | ICD-10-CM | POA: Diagnosis not present

## 2019-12-31 DIAGNOSIS — D511 Vitamin B12 deficiency anemia due to selective vitamin B12 malabsorption with proteinuria: Secondary | ICD-10-CM | POA: Diagnosis not present

## 2019-12-31 DIAGNOSIS — E038 Other specified hypothyroidism: Secondary | ICD-10-CM | POA: Diagnosis not present

## 2019-12-31 DIAGNOSIS — I2510 Atherosclerotic heart disease of native coronary artery without angina pectoris: Secondary | ICD-10-CM | POA: Diagnosis not present

## 2019-12-31 DIAGNOSIS — I10 Essential (primary) hypertension: Secondary | ICD-10-CM | POA: Diagnosis not present

## 2019-12-31 DIAGNOSIS — E119 Type 2 diabetes mellitus without complications: Secondary | ICD-10-CM | POA: Diagnosis not present

## 2019-12-31 DIAGNOSIS — D518 Other vitamin B12 deficiency anemias: Secondary | ICD-10-CM | POA: Diagnosis not present

## 2019-12-31 DIAGNOSIS — E785 Hyperlipidemia, unspecified: Secondary | ICD-10-CM | POA: Diagnosis not present

## 2019-12-31 DIAGNOSIS — E559 Vitamin D deficiency, unspecified: Secondary | ICD-10-CM | POA: Diagnosis not present

## 2020-01-05 DIAGNOSIS — E038 Other specified hypothyroidism: Secondary | ICD-10-CM | POA: Diagnosis not present

## 2020-01-05 DIAGNOSIS — E782 Mixed hyperlipidemia: Secondary | ICD-10-CM | POA: Diagnosis not present

## 2020-01-05 DIAGNOSIS — E1165 Type 2 diabetes mellitus with hyperglycemia: Secondary | ICD-10-CM | POA: Diagnosis not present

## 2020-01-05 DIAGNOSIS — E785 Hyperlipidemia, unspecified: Secondary | ICD-10-CM | POA: Diagnosis not present

## 2020-01-05 DIAGNOSIS — D511 Vitamin B12 deficiency anemia due to selective vitamin B12 malabsorption with proteinuria: Secondary | ICD-10-CM | POA: Diagnosis not present

## 2020-01-05 DIAGNOSIS — E559 Vitamin D deficiency, unspecified: Secondary | ICD-10-CM | POA: Diagnosis not present

## 2020-01-05 DIAGNOSIS — I6523 Occlusion and stenosis of bilateral carotid arteries: Secondary | ICD-10-CM | POA: Diagnosis not present

## 2020-01-05 DIAGNOSIS — D519 Vitamin B12 deficiency anemia, unspecified: Secondary | ICD-10-CM | POA: Diagnosis not present

## 2020-01-05 DIAGNOSIS — D518 Other vitamin B12 deficiency anemias: Secondary | ICD-10-CM | POA: Diagnosis not present

## 2020-01-05 DIAGNOSIS — K219 Gastro-esophageal reflux disease without esophagitis: Secondary | ICD-10-CM | POA: Diagnosis not present

## 2020-01-05 DIAGNOSIS — I2510 Atherosclerotic heart disease of native coronary artery without angina pectoris: Secondary | ICD-10-CM | POA: Diagnosis not present

## 2020-01-05 DIAGNOSIS — E119 Type 2 diabetes mellitus without complications: Secondary | ICD-10-CM | POA: Diagnosis not present

## 2020-01-05 DIAGNOSIS — E291 Testicular hypofunction: Secondary | ICD-10-CM | POA: Diagnosis not present

## 2020-01-05 DIAGNOSIS — I10 Essential (primary) hypertension: Secondary | ICD-10-CM | POA: Diagnosis not present

## 2020-01-13 ENCOUNTER — Ambulatory Visit (INDEPENDENT_AMBULATORY_CARE_PROVIDER_SITE_OTHER): Payer: PPO

## 2020-01-13 DIAGNOSIS — I441 Atrioventricular block, second degree: Secondary | ICD-10-CM

## 2020-01-13 LAB — CUP PACEART REMOTE DEVICE CHECK
Battery Remaining Longevity: 127 mo
Battery Remaining Percentage: 95.5 %
Battery Voltage: 3.01 V
Brady Statistic AP VP Percent: 87 %
Brady Statistic AP VS Percent: 1 %
Brady Statistic AS VP Percent: 11 %
Brady Statistic AS VS Percent: 1.2 %
Brady Statistic RA Percent Paced: 87 %
Brady Statistic RV Percent Paced: 98 %
Date Time Interrogation Session: 20211109082548
Implantable Lead Implant Date: 20200731
Implantable Lead Implant Date: 20200731
Implantable Lead Location: 753859
Implantable Lead Location: 753860
Implantable Pulse Generator Implant Date: 20200731
Lead Channel Impedance Value: 440 Ohm
Lead Channel Impedance Value: 710 Ohm
Lead Channel Pacing Threshold Amplitude: 0.5 V
Lead Channel Pacing Threshold Amplitude: 0.5 V
Lead Channel Pacing Threshold Pulse Width: 0.4 ms
Lead Channel Pacing Threshold Pulse Width: 0.5 ms
Lead Channel Sensing Intrinsic Amplitude: 12 mV
Lead Channel Sensing Intrinsic Amplitude: 5 mV
Lead Channel Setting Pacing Amplitude: 0.75 V
Lead Channel Setting Pacing Amplitude: 1.5 V
Lead Channel Setting Pacing Pulse Width: 0.5 ms
Lead Channel Setting Sensing Sensitivity: 2 mV
Pulse Gen Model: 2272
Pulse Gen Serial Number: 9152674

## 2020-01-14 NOTE — Progress Notes (Signed)
Remote pacemaker transmission.   

## 2020-01-27 DIAGNOSIS — D518 Other vitamin B12 deficiency anemias: Secondary | ICD-10-CM | POA: Diagnosis not present

## 2020-01-27 DIAGNOSIS — E119 Type 2 diabetes mellitus without complications: Secondary | ICD-10-CM | POA: Diagnosis not present

## 2020-01-27 DIAGNOSIS — E038 Other specified hypothyroidism: Secondary | ICD-10-CM | POA: Diagnosis not present

## 2020-01-27 DIAGNOSIS — E1165 Type 2 diabetes mellitus with hyperglycemia: Secondary | ICD-10-CM | POA: Diagnosis not present

## 2020-01-27 DIAGNOSIS — E559 Vitamin D deficiency, unspecified: Secondary | ICD-10-CM | POA: Diagnosis not present

## 2020-01-27 DIAGNOSIS — E785 Hyperlipidemia, unspecified: Secondary | ICD-10-CM | POA: Diagnosis not present

## 2020-01-27 DIAGNOSIS — I10 Essential (primary) hypertension: Secondary | ICD-10-CM | POA: Diagnosis not present

## 2020-01-27 DIAGNOSIS — D511 Vitamin B12 deficiency anemia due to selective vitamin B12 malabsorption with proteinuria: Secondary | ICD-10-CM | POA: Diagnosis not present

## 2020-01-27 DIAGNOSIS — I2510 Atherosclerotic heart disease of native coronary artery without angina pectoris: Secondary | ICD-10-CM | POA: Diagnosis not present

## 2020-01-27 DIAGNOSIS — E782 Mixed hyperlipidemia: Secondary | ICD-10-CM | POA: Diagnosis not present

## 2020-02-19 DIAGNOSIS — E119 Type 2 diabetes mellitus without complications: Secondary | ICD-10-CM | POA: Diagnosis not present

## 2020-02-19 DIAGNOSIS — E782 Mixed hyperlipidemia: Secondary | ICD-10-CM | POA: Diagnosis not present

## 2020-02-19 DIAGNOSIS — E559 Vitamin D deficiency, unspecified: Secondary | ICD-10-CM | POA: Diagnosis not present

## 2020-02-19 DIAGNOSIS — D518 Other vitamin B12 deficiency anemias: Secondary | ICD-10-CM | POA: Diagnosis not present

## 2020-02-19 DIAGNOSIS — I10 Essential (primary) hypertension: Secondary | ICD-10-CM | POA: Diagnosis not present

## 2020-02-19 DIAGNOSIS — I2510 Atherosclerotic heart disease of native coronary artery without angina pectoris: Secondary | ICD-10-CM | POA: Diagnosis not present

## 2020-02-19 DIAGNOSIS — D511 Vitamin B12 deficiency anemia due to selective vitamin B12 malabsorption with proteinuria: Secondary | ICD-10-CM | POA: Diagnosis not present

## 2020-02-19 DIAGNOSIS — E785 Hyperlipidemia, unspecified: Secondary | ICD-10-CM | POA: Diagnosis not present

## 2020-02-19 DIAGNOSIS — E038 Other specified hypothyroidism: Secondary | ICD-10-CM | POA: Diagnosis not present

## 2020-02-19 DIAGNOSIS — E1165 Type 2 diabetes mellitus with hyperglycemia: Secondary | ICD-10-CM | POA: Diagnosis not present

## 2020-03-02 ENCOUNTER — Encounter: Payer: Self-pay | Admitting: Cardiology

## 2020-03-30 DIAGNOSIS — E782 Mixed hyperlipidemia: Secondary | ICD-10-CM | POA: Diagnosis not present

## 2020-03-30 DIAGNOSIS — E119 Type 2 diabetes mellitus without complications: Secondary | ICD-10-CM | POA: Diagnosis not present

## 2020-03-30 DIAGNOSIS — E785 Hyperlipidemia, unspecified: Secondary | ICD-10-CM | POA: Diagnosis not present

## 2020-03-30 DIAGNOSIS — D511 Vitamin B12 deficiency anemia due to selective vitamin B12 malabsorption with proteinuria: Secondary | ICD-10-CM | POA: Diagnosis not present

## 2020-03-30 DIAGNOSIS — I2510 Atherosclerotic heart disease of native coronary artery without angina pectoris: Secondary | ICD-10-CM | POA: Diagnosis not present

## 2020-03-30 DIAGNOSIS — E038 Other specified hypothyroidism: Secondary | ICD-10-CM | POA: Diagnosis not present

## 2020-03-30 DIAGNOSIS — E1165 Type 2 diabetes mellitus with hyperglycemia: Secondary | ICD-10-CM | POA: Diagnosis not present

## 2020-03-30 DIAGNOSIS — D518 Other vitamin B12 deficiency anemias: Secondary | ICD-10-CM | POA: Diagnosis not present

## 2020-03-30 DIAGNOSIS — E559 Vitamin D deficiency, unspecified: Secondary | ICD-10-CM | POA: Diagnosis not present

## 2020-03-30 DIAGNOSIS — I10 Essential (primary) hypertension: Secondary | ICD-10-CM | POA: Diagnosis not present

## 2020-04-05 DIAGNOSIS — I10 Essential (primary) hypertension: Secondary | ICD-10-CM | POA: Diagnosis not present

## 2020-04-05 DIAGNOSIS — E782 Mixed hyperlipidemia: Secondary | ICD-10-CM | POA: Diagnosis not present

## 2020-04-05 DIAGNOSIS — E119 Type 2 diabetes mellitus without complications: Secondary | ICD-10-CM | POA: Diagnosis not present

## 2020-04-05 DIAGNOSIS — E1165 Type 2 diabetes mellitus with hyperglycemia: Secondary | ICD-10-CM | POA: Diagnosis not present

## 2020-04-05 DIAGNOSIS — E559 Vitamin D deficiency, unspecified: Secondary | ICD-10-CM | POA: Diagnosis not present

## 2020-04-05 DIAGNOSIS — R100 Acute abdomen: Secondary | ICD-10-CM | POA: Diagnosis not present

## 2020-04-05 DIAGNOSIS — D518 Other vitamin B12 deficiency anemias: Secondary | ICD-10-CM | POA: Diagnosis not present

## 2020-04-05 DIAGNOSIS — I2510 Atherosclerotic heart disease of native coronary artery without angina pectoris: Secondary | ICD-10-CM | POA: Diagnosis not present

## 2020-04-05 DIAGNOSIS — J329 Chronic sinusitis, unspecified: Secondary | ICD-10-CM | POA: Diagnosis not present

## 2020-04-05 DIAGNOSIS — D511 Vitamin B12 deficiency anemia due to selective vitamin B12 malabsorption with proteinuria: Secondary | ICD-10-CM | POA: Diagnosis not present

## 2020-04-05 DIAGNOSIS — E785 Hyperlipidemia, unspecified: Secondary | ICD-10-CM | POA: Diagnosis not present

## 2020-04-05 DIAGNOSIS — E291 Testicular hypofunction: Secondary | ICD-10-CM | POA: Diagnosis not present

## 2020-04-05 DIAGNOSIS — E038 Other specified hypothyroidism: Secondary | ICD-10-CM | POA: Diagnosis not present

## 2020-04-13 ENCOUNTER — Ambulatory Visit (INDEPENDENT_AMBULATORY_CARE_PROVIDER_SITE_OTHER): Payer: PPO

## 2020-04-13 DIAGNOSIS — I441 Atrioventricular block, second degree: Secondary | ICD-10-CM

## 2020-04-13 LAB — CUP PACEART REMOTE DEVICE CHECK
Battery Remaining Longevity: 127 mo
Battery Remaining Percentage: 95.5 %
Battery Voltage: 3.01 V
Brady Statistic AP VP Percent: 89 %
Brady Statistic AP VS Percent: 1 %
Brady Statistic AS VP Percent: 10 %
Brady Statistic AS VS Percent: 1 %
Brady Statistic RA Percent Paced: 88 %
Brady Statistic RV Percent Paced: 99 %
Date Time Interrogation Session: 20220208090508
Implantable Lead Implant Date: 20200731
Implantable Lead Implant Date: 20200731
Implantable Lead Location: 753859
Implantable Lead Location: 753860
Implantable Pulse Generator Implant Date: 20200731
Lead Channel Impedance Value: 450 Ohm
Lead Channel Impedance Value: 710 Ohm
Lead Channel Pacing Threshold Amplitude: 0.5 V
Lead Channel Pacing Threshold Amplitude: 0.5 V
Lead Channel Pacing Threshold Pulse Width: 0.4 ms
Lead Channel Pacing Threshold Pulse Width: 0.5 ms
Lead Channel Sensing Intrinsic Amplitude: 12 mV
Lead Channel Sensing Intrinsic Amplitude: 3 mV
Lead Channel Setting Pacing Amplitude: 0.75 V
Lead Channel Setting Pacing Amplitude: 1.5 V
Lead Channel Setting Pacing Pulse Width: 0.5 ms
Lead Channel Setting Sensing Sensitivity: 2 mV
Pulse Gen Model: 2272
Pulse Gen Serial Number: 9152674

## 2020-04-19 NOTE — Progress Notes (Signed)
Remote pacemaker transmission.   

## 2020-04-21 DIAGNOSIS — E559 Vitamin D deficiency, unspecified: Secondary | ICD-10-CM | POA: Diagnosis not present

## 2020-04-21 DIAGNOSIS — E782 Mixed hyperlipidemia: Secondary | ICD-10-CM | POA: Diagnosis not present

## 2020-04-21 DIAGNOSIS — E119 Type 2 diabetes mellitus without complications: Secondary | ICD-10-CM | POA: Diagnosis not present

## 2020-04-21 DIAGNOSIS — D511 Vitamin B12 deficiency anemia due to selective vitamin B12 malabsorption with proteinuria: Secondary | ICD-10-CM | POA: Diagnosis not present

## 2020-04-21 DIAGNOSIS — E038 Other specified hypothyroidism: Secondary | ICD-10-CM | POA: Diagnosis not present

## 2020-04-21 DIAGNOSIS — D518 Other vitamin B12 deficiency anemias: Secondary | ICD-10-CM | POA: Diagnosis not present

## 2020-04-21 DIAGNOSIS — E1165 Type 2 diabetes mellitus with hyperglycemia: Secondary | ICD-10-CM | POA: Diagnosis not present

## 2020-04-21 DIAGNOSIS — I10 Essential (primary) hypertension: Secondary | ICD-10-CM | POA: Diagnosis not present

## 2020-04-21 DIAGNOSIS — E785 Hyperlipidemia, unspecified: Secondary | ICD-10-CM | POA: Diagnosis not present

## 2020-04-21 DIAGNOSIS — I2510 Atherosclerotic heart disease of native coronary artery without angina pectoris: Secondary | ICD-10-CM | POA: Diagnosis not present

## 2020-04-29 DIAGNOSIS — E291 Testicular hypofunction: Secondary | ICD-10-CM | POA: Diagnosis not present

## 2020-05-25 IMAGING — DX CHEST - 2 VIEW
2 series · 2 of 2 positions shown · non-contrast
Comparison: Chest radiograph 07/04/2018

CLINICAL DATA: Cardiac device.

EXAM:
CHEST - 2 VIEW

[chest pa]
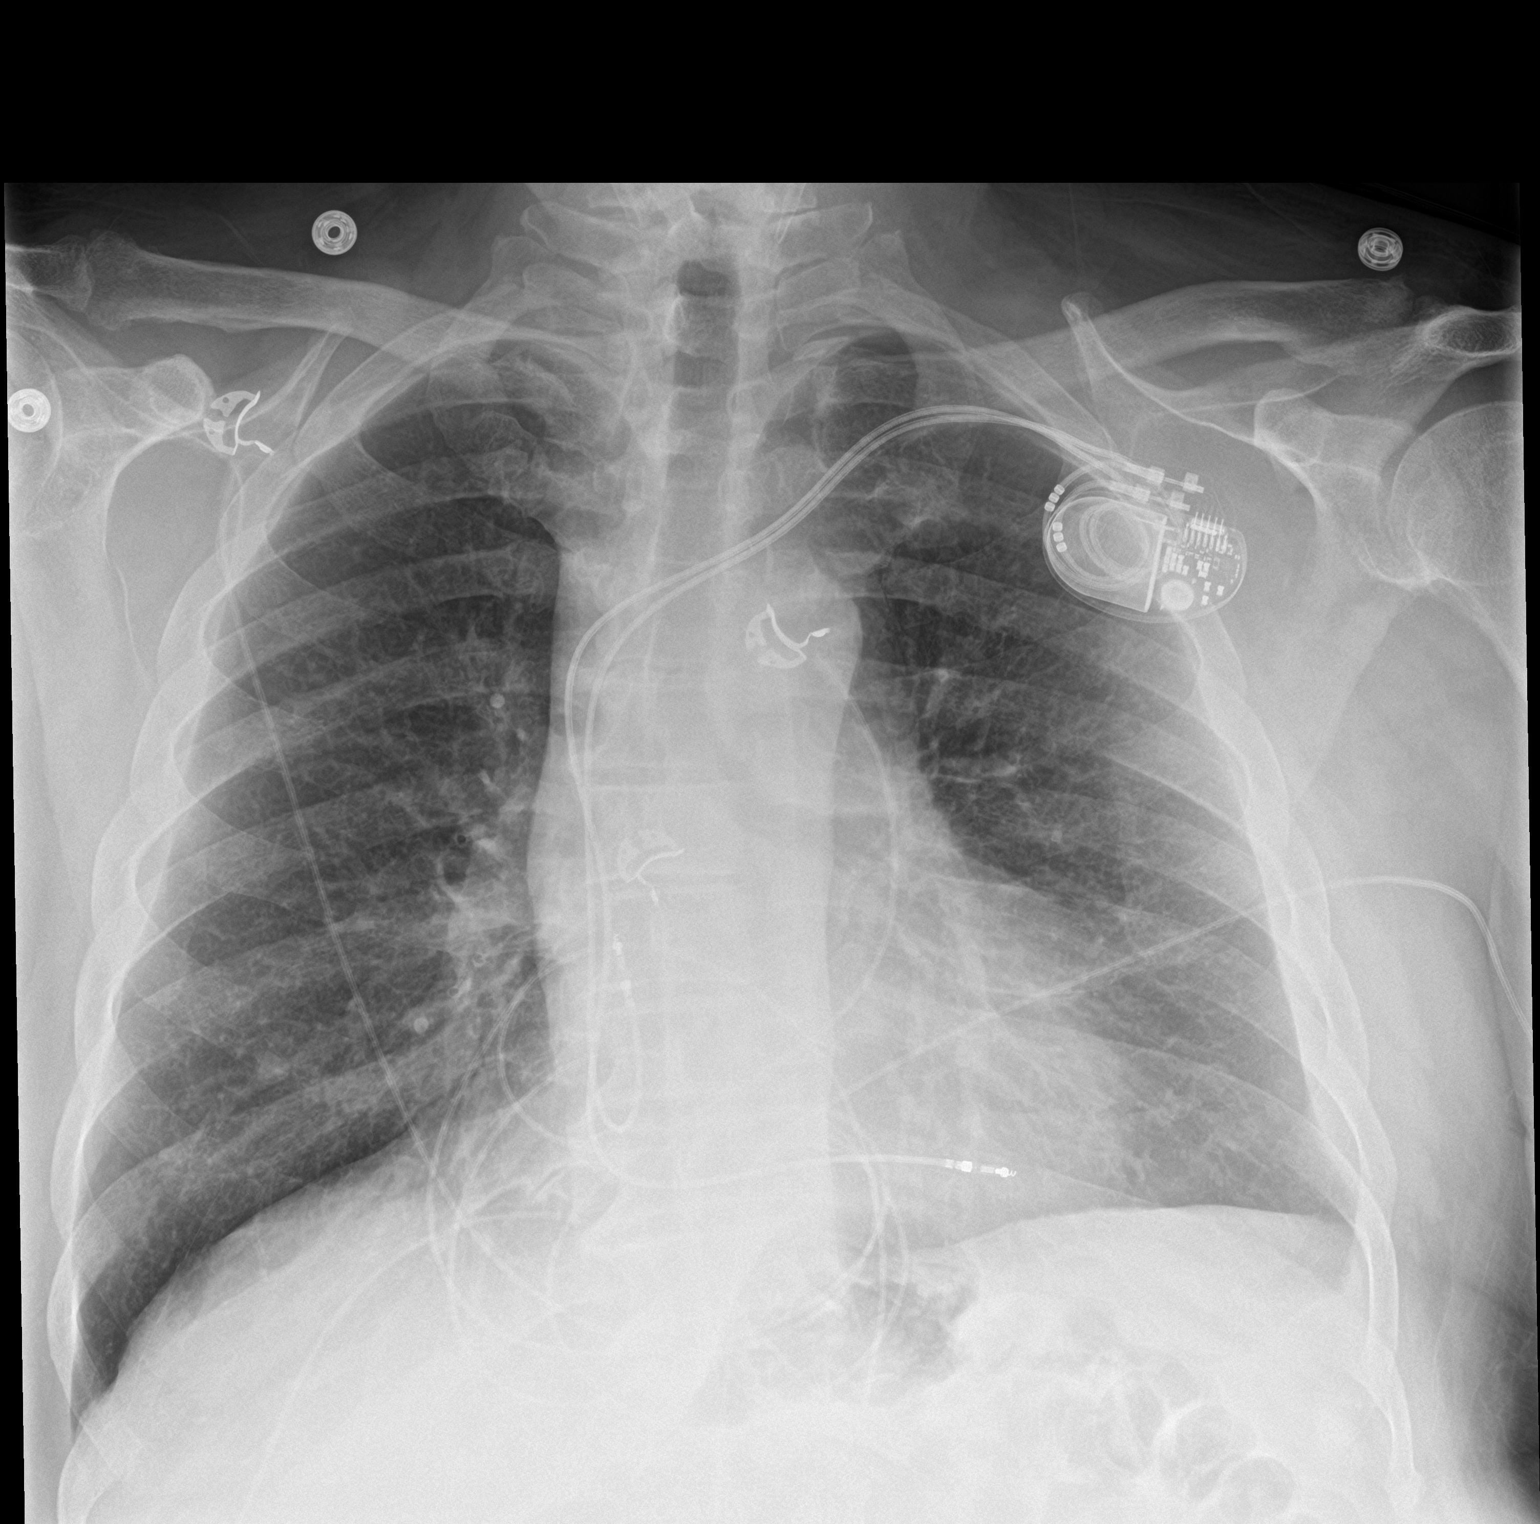

[chest lat]
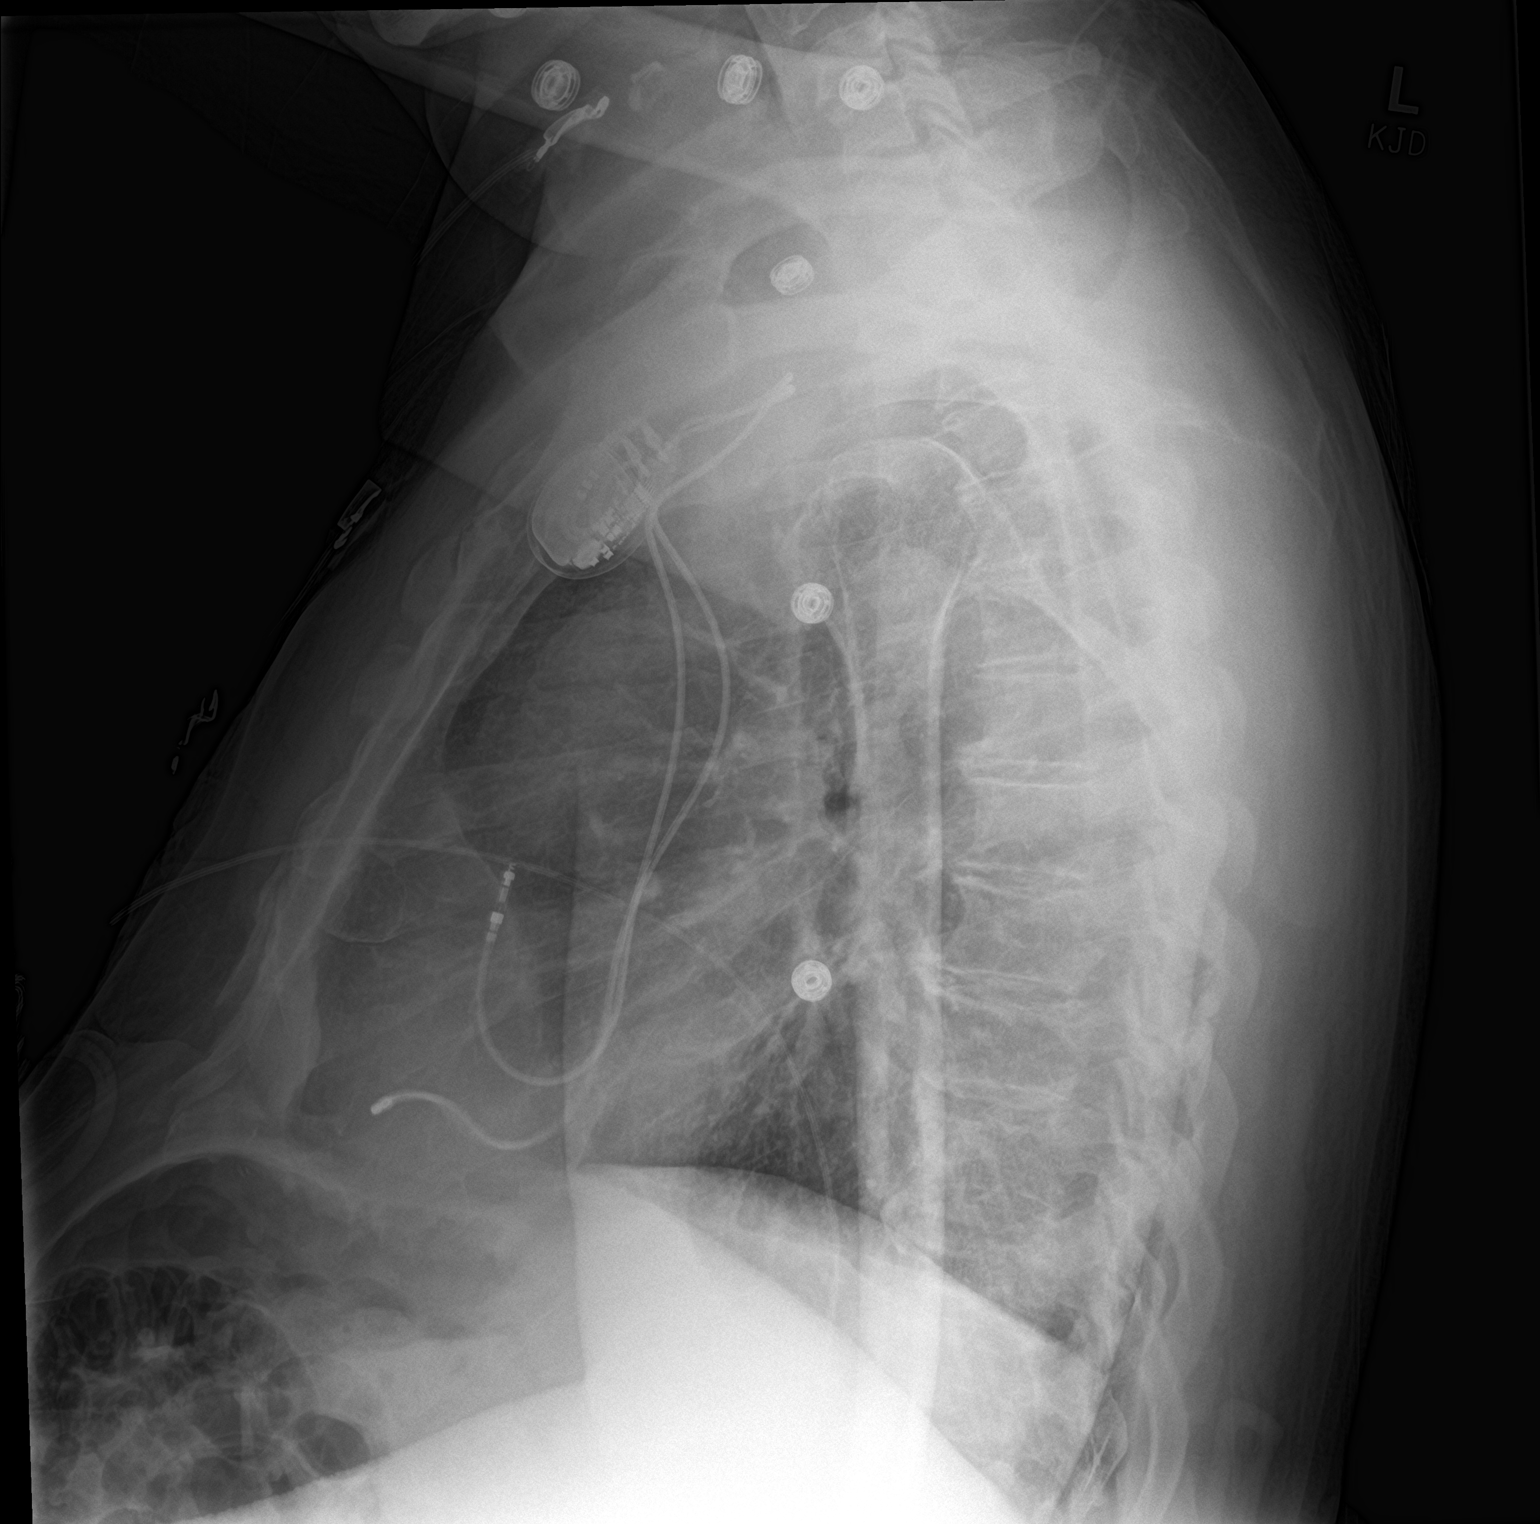

[2 of 2 positions shown; findings below may reference images not displayed]

FINDINGS: Interval insertion dual lead pacer apparatus overlying the left
hemithorax. Stable cardiac and mediastinal contours. Monitoring
leads overlie the patient. No consolidative pulmonary opacities. No
pleural effusion or pneumothorax. Lateral view limited due to
overlapping soft tissue. Thoracic spine degenerative changes.
IMPRESSION: No acute cardiopulmonary process.

New dual lead pacer apparatus.

## 2020-06-03 DIAGNOSIS — E038 Other specified hypothyroidism: Secondary | ICD-10-CM | POA: Diagnosis not present

## 2020-06-03 DIAGNOSIS — D511 Vitamin B12 deficiency anemia due to selective vitamin B12 malabsorption with proteinuria: Secondary | ICD-10-CM | POA: Diagnosis not present

## 2020-06-03 DIAGNOSIS — E782 Mixed hyperlipidemia: Secondary | ICD-10-CM | POA: Diagnosis not present

## 2020-06-03 DIAGNOSIS — E785 Hyperlipidemia, unspecified: Secondary | ICD-10-CM | POA: Diagnosis not present

## 2020-06-03 DIAGNOSIS — E119 Type 2 diabetes mellitus without complications: Secondary | ICD-10-CM | POA: Diagnosis not present

## 2020-06-03 DIAGNOSIS — I10 Essential (primary) hypertension: Secondary | ICD-10-CM | POA: Diagnosis not present

## 2020-06-03 DIAGNOSIS — E559 Vitamin D deficiency, unspecified: Secondary | ICD-10-CM | POA: Diagnosis not present

## 2020-06-03 DIAGNOSIS — E1165 Type 2 diabetes mellitus with hyperglycemia: Secondary | ICD-10-CM | POA: Diagnosis not present

## 2020-06-03 DIAGNOSIS — D518 Other vitamin B12 deficiency anemias: Secondary | ICD-10-CM | POA: Diagnosis not present

## 2020-06-03 DIAGNOSIS — I2510 Atherosclerotic heart disease of native coronary artery without angina pectoris: Secondary | ICD-10-CM | POA: Diagnosis not present

## 2020-06-28 DIAGNOSIS — E119 Type 2 diabetes mellitus without complications: Secondary | ICD-10-CM | POA: Diagnosis not present

## 2020-06-28 DIAGNOSIS — E038 Other specified hypothyroidism: Secondary | ICD-10-CM | POA: Diagnosis not present

## 2020-06-28 DIAGNOSIS — D511 Vitamin B12 deficiency anemia due to selective vitamin B12 malabsorption with proteinuria: Secondary | ICD-10-CM | POA: Diagnosis not present

## 2020-06-28 DIAGNOSIS — E782 Mixed hyperlipidemia: Secondary | ICD-10-CM | POA: Diagnosis not present

## 2020-06-28 DIAGNOSIS — I2510 Atherosclerotic heart disease of native coronary artery without angina pectoris: Secondary | ICD-10-CM | POA: Diagnosis not present

## 2020-06-28 DIAGNOSIS — D518 Other vitamin B12 deficiency anemias: Secondary | ICD-10-CM | POA: Diagnosis not present

## 2020-06-28 DIAGNOSIS — I10 Essential (primary) hypertension: Secondary | ICD-10-CM | POA: Diagnosis not present

## 2020-06-28 DIAGNOSIS — E785 Hyperlipidemia, unspecified: Secondary | ICD-10-CM | POA: Diagnosis not present

## 2020-06-28 DIAGNOSIS — E559 Vitamin D deficiency, unspecified: Secondary | ICD-10-CM | POA: Diagnosis not present

## 2020-06-28 DIAGNOSIS — E1165 Type 2 diabetes mellitus with hyperglycemia: Secondary | ICD-10-CM | POA: Diagnosis not present

## 2020-07-01 DIAGNOSIS — E119 Type 2 diabetes mellitus without complications: Secondary | ICD-10-CM | POA: Diagnosis not present

## 2020-07-01 DIAGNOSIS — E559 Vitamin D deficiency, unspecified: Secondary | ICD-10-CM | POA: Diagnosis not present

## 2020-07-01 DIAGNOSIS — Z0000 Encounter for general adult medical examination without abnormal findings: Secondary | ICD-10-CM | POA: Diagnosis not present

## 2020-07-01 DIAGNOSIS — E038 Other specified hypothyroidism: Secondary | ICD-10-CM | POA: Diagnosis not present

## 2020-07-01 DIAGNOSIS — E782 Mixed hyperlipidemia: Secondary | ICD-10-CM | POA: Diagnosis not present

## 2020-07-01 DIAGNOSIS — E1165 Type 2 diabetes mellitus with hyperglycemia: Secondary | ICD-10-CM | POA: Diagnosis not present

## 2020-07-01 DIAGNOSIS — I10 Essential (primary) hypertension: Secondary | ICD-10-CM | POA: Diagnosis not present

## 2020-07-13 ENCOUNTER — Ambulatory Visit (INDEPENDENT_AMBULATORY_CARE_PROVIDER_SITE_OTHER): Payer: PPO

## 2020-07-13 DIAGNOSIS — I441 Atrioventricular block, second degree: Secondary | ICD-10-CM

## 2020-07-13 LAB — CUP PACEART REMOTE DEVICE CHECK
Battery Remaining Longevity: 127 mo
Battery Remaining Percentage: 95.5 %
Battery Voltage: 3.01 V
Brady Statistic AP VP Percent: 90 %
Brady Statistic AP VS Percent: 1 %
Brady Statistic AS VP Percent: 9.2 %
Brady Statistic AS VS Percent: 1 %
Brady Statistic RA Percent Paced: 89 %
Brady Statistic RV Percent Paced: 99 %
Date Time Interrogation Session: 20220510024557
Implantable Lead Implant Date: 20200731
Implantable Lead Implant Date: 20200731
Implantable Lead Location: 753859
Implantable Lead Location: 753860
Implantable Pulse Generator Implant Date: 20200731
Lead Channel Impedance Value: 440 Ohm
Lead Channel Impedance Value: 740 Ohm
Lead Channel Pacing Threshold Amplitude: 0.5 V
Lead Channel Pacing Threshold Amplitude: 0.5 V
Lead Channel Pacing Threshold Pulse Width: 0.4 ms
Lead Channel Pacing Threshold Pulse Width: 0.5 ms
Lead Channel Sensing Intrinsic Amplitude: 12 mV
Lead Channel Sensing Intrinsic Amplitude: 5 mV
Lead Channel Setting Pacing Amplitude: 0.75 V
Lead Channel Setting Pacing Amplitude: 1.5 V
Lead Channel Setting Pacing Pulse Width: 0.5 ms
Lead Channel Setting Sensing Sensitivity: 2 mV
Pulse Gen Model: 2272
Pulse Gen Serial Number: 9152674

## 2020-07-30 DIAGNOSIS — E1165 Type 2 diabetes mellitus with hyperglycemia: Secondary | ICD-10-CM | POA: Diagnosis not present

## 2020-07-30 DIAGNOSIS — E559 Vitamin D deficiency, unspecified: Secondary | ICD-10-CM | POA: Diagnosis not present

## 2020-07-30 DIAGNOSIS — E038 Other specified hypothyroidism: Secondary | ICD-10-CM | POA: Diagnosis not present

## 2020-07-30 DIAGNOSIS — I2510 Atherosclerotic heart disease of native coronary artery without angina pectoris: Secondary | ICD-10-CM | POA: Diagnosis not present

## 2020-07-30 DIAGNOSIS — E119 Type 2 diabetes mellitus without complications: Secondary | ICD-10-CM | POA: Diagnosis not present

## 2020-07-30 DIAGNOSIS — D518 Other vitamin B12 deficiency anemias: Secondary | ICD-10-CM | POA: Diagnosis not present

## 2020-07-30 DIAGNOSIS — E785 Hyperlipidemia, unspecified: Secondary | ICD-10-CM | POA: Diagnosis not present

## 2020-07-30 DIAGNOSIS — D511 Vitamin B12 deficiency anemia due to selective vitamin B12 malabsorption with proteinuria: Secondary | ICD-10-CM | POA: Diagnosis not present

## 2020-07-30 DIAGNOSIS — E782 Mixed hyperlipidemia: Secondary | ICD-10-CM | POA: Diagnosis not present

## 2020-07-30 DIAGNOSIS — I10 Essential (primary) hypertension: Secondary | ICD-10-CM | POA: Diagnosis not present

## 2020-08-04 NOTE — Progress Notes (Signed)
Remote pacemaker transmission.   

## 2020-09-02 DIAGNOSIS — E782 Mixed hyperlipidemia: Secondary | ICD-10-CM | POA: Diagnosis not present

## 2020-09-02 DIAGNOSIS — I2510 Atherosclerotic heart disease of native coronary artery without angina pectoris: Secondary | ICD-10-CM | POA: Diagnosis not present

## 2020-09-02 DIAGNOSIS — E785 Hyperlipidemia, unspecified: Secondary | ICD-10-CM | POA: Diagnosis not present

## 2020-09-02 DIAGNOSIS — D518 Other vitamin B12 deficiency anemias: Secondary | ICD-10-CM | POA: Diagnosis not present

## 2020-09-02 DIAGNOSIS — D511 Vitamin B12 deficiency anemia due to selective vitamin B12 malabsorption with proteinuria: Secondary | ICD-10-CM | POA: Diagnosis not present

## 2020-09-02 DIAGNOSIS — E559 Vitamin D deficiency, unspecified: Secondary | ICD-10-CM | POA: Diagnosis not present

## 2020-09-02 DIAGNOSIS — E1165 Type 2 diabetes mellitus with hyperglycemia: Secondary | ICD-10-CM | POA: Diagnosis not present

## 2020-09-02 DIAGNOSIS — E119 Type 2 diabetes mellitus without complications: Secondary | ICD-10-CM | POA: Diagnosis not present

## 2020-09-02 DIAGNOSIS — E038 Other specified hypothyroidism: Secondary | ICD-10-CM | POA: Diagnosis not present

## 2020-09-02 DIAGNOSIS — I10 Essential (primary) hypertension: Secondary | ICD-10-CM | POA: Diagnosis not present

## 2020-09-30 DIAGNOSIS — E782 Mixed hyperlipidemia: Secondary | ICD-10-CM | POA: Diagnosis not present

## 2020-09-30 DIAGNOSIS — I10 Essential (primary) hypertension: Secondary | ICD-10-CM | POA: Diagnosis not present

## 2020-09-30 DIAGNOSIS — E291 Testicular hypofunction: Secondary | ICD-10-CM | POA: Diagnosis not present

## 2020-09-30 DIAGNOSIS — E1165 Type 2 diabetes mellitus with hyperglycemia: Secondary | ICD-10-CM | POA: Diagnosis not present

## 2020-09-30 DIAGNOSIS — D518 Other vitamin B12 deficiency anemias: Secondary | ICD-10-CM | POA: Diagnosis not present

## 2020-09-30 DIAGNOSIS — E119 Type 2 diabetes mellitus without complications: Secondary | ICD-10-CM | POA: Diagnosis not present

## 2020-09-30 DIAGNOSIS — E038 Other specified hypothyroidism: Secondary | ICD-10-CM | POA: Diagnosis not present

## 2020-09-30 DIAGNOSIS — E559 Vitamin D deficiency, unspecified: Secondary | ICD-10-CM | POA: Diagnosis not present

## 2020-10-01 DIAGNOSIS — E785 Hyperlipidemia, unspecified: Secondary | ICD-10-CM | POA: Diagnosis not present

## 2020-10-01 DIAGNOSIS — D511 Vitamin B12 deficiency anemia due to selective vitamin B12 malabsorption with proteinuria: Secondary | ICD-10-CM | POA: Diagnosis not present

## 2020-10-01 DIAGNOSIS — E038 Other specified hypothyroidism: Secondary | ICD-10-CM | POA: Diagnosis not present

## 2020-10-01 DIAGNOSIS — E1165 Type 2 diabetes mellitus with hyperglycemia: Secondary | ICD-10-CM | POA: Diagnosis not present

## 2020-10-01 DIAGNOSIS — E782 Mixed hyperlipidemia: Secondary | ICD-10-CM | POA: Diagnosis not present

## 2020-10-01 DIAGNOSIS — E559 Vitamin D deficiency, unspecified: Secondary | ICD-10-CM | POA: Diagnosis not present

## 2020-10-01 DIAGNOSIS — I10 Essential (primary) hypertension: Secondary | ICD-10-CM | POA: Diagnosis not present

## 2020-10-01 DIAGNOSIS — D518 Other vitamin B12 deficiency anemias: Secondary | ICD-10-CM | POA: Diagnosis not present

## 2020-10-01 DIAGNOSIS — I2510 Atherosclerotic heart disease of native coronary artery without angina pectoris: Secondary | ICD-10-CM | POA: Diagnosis not present

## 2020-10-01 DIAGNOSIS — E119 Type 2 diabetes mellitus without complications: Secondary | ICD-10-CM | POA: Diagnosis not present

## 2020-10-12 ENCOUNTER — Ambulatory Visit (INDEPENDENT_AMBULATORY_CARE_PROVIDER_SITE_OTHER): Payer: PPO

## 2020-10-12 DIAGNOSIS — I441 Atrioventricular block, second degree: Secondary | ICD-10-CM | POA: Diagnosis not present

## 2020-10-12 LAB — CUP PACEART REMOTE DEVICE CHECK
Battery Remaining Longevity: 75 mo
Battery Remaining Percentage: 82 %
Battery Voltage: 3.01 V
Brady Statistic AP VP Percent: 89 %
Brady Statistic AP VS Percent: 1 %
Brady Statistic AS VP Percent: 9.9 %
Brady Statistic AS VS Percent: 1 %
Brady Statistic RA Percent Paced: 88 %
Brady Statistic RV Percent Paced: 99 %
Date Time Interrogation Session: 20220809032624
Implantable Lead Implant Date: 20200731
Implantable Lead Implant Date: 20200731
Implantable Lead Location: 753859
Implantable Lead Location: 753860
Implantable Pulse Generator Implant Date: 20200731
Lead Channel Impedance Value: 440 Ohm
Lead Channel Impedance Value: 710 Ohm
Lead Channel Pacing Threshold Amplitude: 0.375 V
Lead Channel Pacing Threshold Amplitude: 0.5 V
Lead Channel Pacing Threshold Pulse Width: 0.4 ms
Lead Channel Pacing Threshold Pulse Width: 0.5 ms
Lead Channel Sensing Intrinsic Amplitude: 12 mV
Lead Channel Sensing Intrinsic Amplitude: 5 mV
Lead Channel Setting Pacing Amplitude: 0.75 V
Lead Channel Setting Pacing Amplitude: 5 V
Lead Channel Setting Pacing Pulse Width: 0.5 ms
Lead Channel Setting Sensing Sensitivity: 2 mV
Pulse Gen Model: 2272
Pulse Gen Serial Number: 9152674

## 2020-10-22 DIAGNOSIS — E785 Hyperlipidemia, unspecified: Secondary | ICD-10-CM | POA: Diagnosis not present

## 2020-10-22 DIAGNOSIS — E1165 Type 2 diabetes mellitus with hyperglycemia: Secondary | ICD-10-CM | POA: Diagnosis not present

## 2020-10-22 DIAGNOSIS — E119 Type 2 diabetes mellitus without complications: Secondary | ICD-10-CM | POA: Diagnosis not present

## 2020-10-22 DIAGNOSIS — E782 Mixed hyperlipidemia: Secondary | ICD-10-CM | POA: Diagnosis not present

## 2020-10-22 DIAGNOSIS — D518 Other vitamin B12 deficiency anemias: Secondary | ICD-10-CM | POA: Diagnosis not present

## 2020-10-22 DIAGNOSIS — I10 Essential (primary) hypertension: Secondary | ICD-10-CM | POA: Diagnosis not present

## 2020-10-22 DIAGNOSIS — I2510 Atherosclerotic heart disease of native coronary artery without angina pectoris: Secondary | ICD-10-CM | POA: Diagnosis not present

## 2020-10-22 DIAGNOSIS — E559 Vitamin D deficiency, unspecified: Secondary | ICD-10-CM | POA: Diagnosis not present

## 2020-10-22 DIAGNOSIS — D511 Vitamin B12 deficiency anemia due to selective vitamin B12 malabsorption with proteinuria: Secondary | ICD-10-CM | POA: Diagnosis not present

## 2020-10-22 DIAGNOSIS — E038 Other specified hypothyroidism: Secondary | ICD-10-CM | POA: Diagnosis not present

## 2020-11-02 ENCOUNTER — Telehealth: Payer: Self-pay

## 2020-11-02 NOTE — Telephone Encounter (Signed)
-----   Message from Will [name], MD sent at 10/28/2020  3:27 PM EDT ----- Needs follow up for atrial lead

## 2020-11-02 NOTE — Telephone Encounter (Signed)
Successful telephone encounter to patient to scheduled device clinic appointment per Dr. Curt Bears recommendation "to clinic check for atrial threshold and outputs. Turn off A cap confirm" secondary to recent abnormal remote transmission. Patient is scheduled for device clinic Wednesday, 11/03/20 at 9:20 am.

## 2020-11-03 ENCOUNTER — Other Ambulatory Visit: Payer: Self-pay

## 2020-11-03 ENCOUNTER — Ambulatory Visit (INDEPENDENT_AMBULATORY_CARE_PROVIDER_SITE_OTHER): Payer: PPO

## 2020-11-03 DIAGNOSIS — I459 Conduction disorder, unspecified: Secondary | ICD-10-CM | POA: Diagnosis not present

## 2020-11-03 LAB — CUP PACEART INCLINIC DEVICE CHECK
Battery Remaining Longevity: 88 mo
Battery Voltage: 3.01 V
Brady Statistic RA Percent Paced: 88 %
Brady Statistic RV Percent Paced: 99.19 %
Date Time Interrogation Session: 20220831094605
Implantable Lead Implant Date: 20200731
Implantable Lead Implant Date: 20200731
Implantable Lead Location: 753859
Implantable Lead Location: 753860
Implantable Pulse Generator Implant Date: 20200731
Lead Channel Impedance Value: 437.5 Ohm
Lead Channel Impedance Value: 712.5 Ohm
Lead Channel Pacing Threshold Amplitude: 0.5 V
Lead Channel Pacing Threshold Amplitude: 0.5 V
Lead Channel Pacing Threshold Pulse Width: 0.4 ms
Lead Channel Pacing Threshold Pulse Width: 0.5 ms
Lead Channel Sensing Intrinsic Amplitude: 5 mV
Lead Channel Setting Pacing Amplitude: 0.75 V
Lead Channel Setting Pacing Amplitude: 2 V
Lead Channel Setting Pacing Pulse Width: 0.5 ms
Lead Channel Setting Sensing Sensitivity: 2 mV
Pulse Gen Model: 2272
Pulse Gen Serial Number: 9152674

## 2020-11-03 NOTE — Progress Notes (Signed)
Pacemaker check in clinic. Normal device function. Thresholds (RV), sensing, impedances consistent with previous measurements. RA threshold was elevated per previous remote d/t AT/AF rhythm. RA threshold checked today and was 0.5V @ 0.4 ms. CC programmed off in RA and output set to 2.0 V allowing 2:1 safety margin. Device programmed to maximize longevity. AT/AF burden <1%. Device programmed at appropriate safety margins. Histogram distribution appropriate for patient activity level. Estimated longevity 7.4-8.5 years. Patient enrolled in remote follow-up 01/11/21. Patient education completed.  Patient does report increased fatigue towards then end of day while eating dinner and at times feels palpitations. Compliant with medications on file. Patient has upcoming in-clinic visit with Dr. Curt Bears on 11/22/20.

## 2020-11-04 NOTE — Progress Notes (Signed)
Remote pacemaker transmission.   

## 2020-11-22 ENCOUNTER — Other Ambulatory Visit: Payer: Self-pay

## 2020-11-22 ENCOUNTER — Ambulatory Visit (INDEPENDENT_AMBULATORY_CARE_PROVIDER_SITE_OTHER): Payer: PPO | Admitting: Cardiology

## 2020-11-22 ENCOUNTER — Encounter: Payer: Self-pay | Admitting: Cardiology

## 2020-11-22 VITALS — BP 130/72 | HR 78 | Ht 68.0 in | Wt 253.4 lb

## 2020-11-22 DIAGNOSIS — I441 Atrioventricular block, second degree: Secondary | ICD-10-CM

## 2020-11-22 NOTE — Progress Notes (Signed)
Electrophysiology Office Note   Date:  11/22/2020   ID:  Jay Hanson, DOB [birth year], MRN [id number]  PCP:  Jay Nasuti, MD  Cardiologist: Jay Hanson Primary Electrophysiologist:  Jay Hanson Jay Leeds, MD    Chief Complaint: Pacemaker   History of Present Illness: Jay Hanson is a 70 y.o. male who is being seen today for the evaluation of pacemaker at the request of Hague, Rosalyn Charters, MD. Presenting today for electrophysiology evaluation.  He has a history significant for hypertension, diabetes, coronary artery disease status post STEMI and PCI in 2014 associated with cardiac arrest.  He presented to [facility] in July 2020 with weakness, near syncope, and palpitations.  He was found to be in junctional rhythm with heart rates in the 40s to 50s.  He is status post [place] dual-chamber pacemaker implanted 10/04/2018.  Today, denies symptoms of palpitations, chest pain, orthopnea, PND, lower extremity edema, claudication, dizziness, presyncope, syncope, bleeding, or neurologic sequela. The patient is tolerating medications without difficulties.  He is currently feeling well.  He has intermittent shortness of breath.  Initially he feels like his shortness of breath was due to the timing of taking his carvedilol.  Upon further discussion, his blood sugar is elevated when he is short of breath.  He takes an extra few units of insulin which brings his blood sugar down his shortness of breath resolves.  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   Coronary artery disease    Diabetes mellitus without complication (HCC)    MI (myocardial infarction) [facility])    Past Surgical History:  Procedure Laterality Date   CARDIAC SURGERY     COLONOSCOPY     PACEMAKER IMPLANT N/A 10/04/2018   Procedure: PACEMAKER IMPLANT;  Surgeon: Constance Haw, MD;  Location: [place] [facility];  Service: Cardiovascular;  Laterality: N/A;     Current Outpatient Medications   Medication Sig Dispense Refill   aspirin EC 81 MG tablet Take 81 mg by mouth daily.     carvedilol (COREG) 6.25 MG tablet Take 3.125 mg by mouth in the morning, at noon, in the evening, and at bedtime.     cetirizine (ZYRTEC) 10 MG tablet Take 10 mg by mouth daily.      Cholecalciferol (VITAMIN D3) 125 MCG (5000 UT) TABS Take 5,000 Units by mouth daily.     clopidogrel (PLAVIX) 75 MG tablet Take 75 mg by mouth daily.     fluticasone (FLONASE) 50 MCG/ACT nasal spray Place 1 spray into both nostrils daily as needed for allergies.  2   glucose blood test strip USE TO CHECK GLUCOSE 4 TIMES DAILY     insulin aspart protamine- aspart (NOVOLOG MIX 70/30) (70-30) 100 UNIT/ML injection Inject 0-100 Units into the skin as needed (for blood sugar).     insulin detemir (LEVEMIR) 100 UNIT/ML injection Inject 60 Units into the skin 2 (two) times daily.      levocetirizine (XYZAL) 5 MG tablet Take 5 mg by mouth every evening.     pantoprazole (PROTONIX) 40 MG tablet Take 40 mg by mouth daily.     sitaGLIPtin (JANUVIA) 100 MG tablet Take 100 mg by mouth daily.     No current facility-administered medications for this visit.    Allergies:   Azithromycin and Canagliflozin   Social History:  The patient  reports that he has quit smoking. He has never used smokeless tobacco. He reports that he does not drink alcohol and  does not use drugs.   Family History:  The patient's family history includes Heart disease in his brother.   ROS:  Please see the history of present illness.   Otherwise, review of systems is positive for none.   All other systems are reviewed and negative.   PHYSICAL EXAM: VS:  BP 130/72   Pulse 78   Ht 5\' 8"  (1.727 m)   Wt 253 lb 6.4 oz (114.9 kg)   SpO2 97%   BMI 38.53 kg/m  , BMI Body mass index is 38.53 kg/m. GEN: Well nourished, well developed, in no acute distress  HEENT: normal  Neck: no JVD, carotid bruits, or masses Cardiac: RRR; no murmurs, rubs, or gallops,no edema   Respiratory:  clear to auscultation bilaterally, normal work of breathing GI: soft, nontender, nondistended, + BS MS: no deformity or atrophy  Skin: warm and dry, device site well healed Neuro:  Strength and sensation are intact Psych: euthymic mood, full affect  EKG:  EKG is ordered today. Personal review of the ekg ordered shows sinus rhythm, ventricular paced, atrial bigeminy  Personal review of the device interrogation today. Results in [place]: No results found for requested labs within last 8760 hours.    Lipid Panel  No results found for: CHOL, TRIG, HDL, CHOLHDL, VLDL, LDLCALC, LDLDIRECT   Wt Readings from Last 3 Encounters:  11/22/20 253 lb 6.4 oz (114.9 kg)  11/06/19 254 lb (115.2 kg)  01/06/19 240 lb (108.9 kg)      Other studies Reviewed: Additional studies/ records that were reviewed today include: TTE 06/2016  Review of the above records today demonstrates:   Overall normal left ventricular systolic function, ejection fraction 55 to 60%  Normal right ventricular systolic function    ASSESSMENT AND PLAN:  1.  Second-degree AV block: Status post Saint Jude dual-chamber pacemaker implanted 10/04/2018.  Device functioning appropriately.  No changes at this time.  2.  Coronary artery disease: Status post PCI in 2014.  No current chest pain.  3.  Hypertension: Currently well controlled  Current medicines are reviewed at length with the patient today.   The patient does not have concerns regarding his medicines.  The following changes were made today: none  Labs/ tests ordered today include:  Orders Placed This Encounter  Procedures   EKG 12-Lead    Disposition:   FU with Ziva Nunziata 12 months  Signed, Takeyla Million Jay Leeds, MD  11/22/2020 2:49 PM     [place] [address] [place] [address] [phone] (office) [phone] (fax)

## 2020-11-23 DIAGNOSIS — E1165 Type 2 diabetes mellitus with hyperglycemia: Secondary | ICD-10-CM | POA: Diagnosis not present

## 2020-11-23 DIAGNOSIS — E782 Mixed hyperlipidemia: Secondary | ICD-10-CM | POA: Diagnosis not present

## 2020-11-23 DIAGNOSIS — E785 Hyperlipidemia, unspecified: Secondary | ICD-10-CM | POA: Diagnosis not present

## 2020-11-23 DIAGNOSIS — E119 Type 2 diabetes mellitus without complications: Secondary | ICD-10-CM | POA: Diagnosis not present

## 2020-11-23 DIAGNOSIS — E038 Other specified hypothyroidism: Secondary | ICD-10-CM | POA: Diagnosis not present

## 2020-11-23 DIAGNOSIS — I10 Essential (primary) hypertension: Secondary | ICD-10-CM | POA: Diagnosis not present

## 2020-11-23 DIAGNOSIS — D518 Other vitamin B12 deficiency anemias: Secondary | ICD-10-CM | POA: Diagnosis not present

## 2020-11-23 DIAGNOSIS — E559 Vitamin D deficiency, unspecified: Secondary | ICD-10-CM | POA: Diagnosis not present

## 2020-11-23 DIAGNOSIS — D511 Vitamin B12 deficiency anemia due to selective vitamin B12 malabsorption with proteinuria: Secondary | ICD-10-CM | POA: Diagnosis not present

## 2020-11-23 DIAGNOSIS — I2510 Atherosclerotic heart disease of native coronary artery without angina pectoris: Secondary | ICD-10-CM | POA: Diagnosis not present

## 2020-12-31 DIAGNOSIS — D518 Other vitamin B12 deficiency anemias: Secondary | ICD-10-CM | POA: Diagnosis not present

## 2020-12-31 DIAGNOSIS — E559 Vitamin D deficiency, unspecified: Secondary | ICD-10-CM | POA: Diagnosis not present

## 2020-12-31 DIAGNOSIS — I10 Essential (primary) hypertension: Secondary | ICD-10-CM | POA: Diagnosis not present

## 2020-12-31 DIAGNOSIS — E7849 Other hyperlipidemia: Secondary | ICD-10-CM | POA: Diagnosis not present

## 2020-12-31 DIAGNOSIS — E119 Type 2 diabetes mellitus without complications: Secondary | ICD-10-CM | POA: Diagnosis not present

## 2020-12-31 DIAGNOSIS — E1165 Type 2 diabetes mellitus with hyperglycemia: Secondary | ICD-10-CM | POA: Diagnosis not present

## 2020-12-31 DIAGNOSIS — E038 Other specified hypothyroidism: Secondary | ICD-10-CM | POA: Diagnosis not present

## 2020-12-31 DIAGNOSIS — K219 Gastro-esophageal reflux disease without esophagitis: Secondary | ICD-10-CM | POA: Diagnosis not present

## 2020-12-31 DIAGNOSIS — E782 Mixed hyperlipidemia: Secondary | ICD-10-CM | POA: Diagnosis not present

## 2021-01-03 DIAGNOSIS — E559 Vitamin D deficiency, unspecified: Secondary | ICD-10-CM | POA: Diagnosis not present

## 2021-01-03 DIAGNOSIS — E785 Hyperlipidemia, unspecified: Secondary | ICD-10-CM | POA: Diagnosis not present

## 2021-01-03 DIAGNOSIS — E1165 Type 2 diabetes mellitus with hyperglycemia: Secondary | ICD-10-CM | POA: Diagnosis not present

## 2021-01-03 DIAGNOSIS — D511 Vitamin B12 deficiency anemia due to selective vitamin B12 malabsorption with proteinuria: Secondary | ICD-10-CM | POA: Diagnosis not present

## 2021-01-03 DIAGNOSIS — I10 Essential (primary) hypertension: Secondary | ICD-10-CM | POA: Diagnosis not present

## 2021-01-03 DIAGNOSIS — E038 Other specified hypothyroidism: Secondary | ICD-10-CM | POA: Diagnosis not present

## 2021-01-03 DIAGNOSIS — E782 Mixed hyperlipidemia: Secondary | ICD-10-CM | POA: Diagnosis not present

## 2021-01-03 DIAGNOSIS — I2510 Atherosclerotic heart disease of native coronary artery without angina pectoris: Secondary | ICD-10-CM | POA: Diagnosis not present

## 2021-01-03 DIAGNOSIS — D518 Other vitamin B12 deficiency anemias: Secondary | ICD-10-CM | POA: Diagnosis not present

## 2021-01-03 DIAGNOSIS — E119 Type 2 diabetes mellitus without complications: Secondary | ICD-10-CM | POA: Diagnosis not present

## 2021-01-11 ENCOUNTER — Ambulatory Visit (INDEPENDENT_AMBULATORY_CARE_PROVIDER_SITE_OTHER): Payer: PPO

## 2021-01-11 DIAGNOSIS — I441 Atrioventricular block, second degree: Secondary | ICD-10-CM | POA: Diagnosis not present

## 2021-01-11 LAB — CUP PACEART REMOTE DEVICE CHECK
Battery Remaining Longevity: 98 mo
Battery Remaining Percentage: 79 %
Battery Voltage: 3.01 V
Brady Statistic AP VP Percent: 92 %
Brady Statistic AP VS Percent: 1 %
Brady Statistic AS VP Percent: 7.3 %
Brady Statistic AS VS Percent: 1 %
Brady Statistic RA Percent Paced: 90 %
Brady Statistic RV Percent Paced: 99 %
Date Time Interrogation Session: 20221108090500
Implantable Lead Implant Date: 20200731
Implantable Lead Implant Date: 20200731
Implantable Lead Location: 753859
Implantable Lead Location: 753860
Implantable Pulse Generator Implant Date: 20200731
Lead Channel Impedance Value: 440 Ohm
Lead Channel Impedance Value: 700 Ohm
Lead Channel Pacing Threshold Amplitude: 0.5 V
Lead Channel Pacing Threshold Amplitude: 0.5 V
Lead Channel Pacing Threshold Pulse Width: 0.4 ms
Lead Channel Pacing Threshold Pulse Width: 0.5 ms
Lead Channel Sensing Intrinsic Amplitude: 12 mV
Lead Channel Sensing Intrinsic Amplitude: 3.2 mV
Lead Channel Setting Pacing Amplitude: 0.75 V
Lead Channel Setting Pacing Amplitude: 1.5 V
Lead Channel Setting Pacing Pulse Width: 0.5 ms
Lead Channel Setting Sensing Sensitivity: 2 mV
Pulse Gen Model: 2272
Pulse Gen Serial Number: 9152674

## 2021-01-19 NOTE — Progress Notes (Signed)
Remote pacemaker transmission.   

## 2021-01-26 DIAGNOSIS — D511 Vitamin B12 deficiency anemia due to selective vitamin B12 malabsorption with proteinuria: Secondary | ICD-10-CM | POA: Diagnosis not present

## 2021-01-26 DIAGNOSIS — E119 Type 2 diabetes mellitus without complications: Secondary | ICD-10-CM | POA: Diagnosis not present

## 2021-01-26 DIAGNOSIS — D518 Other vitamin B12 deficiency anemias: Secondary | ICD-10-CM | POA: Diagnosis not present

## 2021-01-26 DIAGNOSIS — I2510 Atherosclerotic heart disease of native coronary artery without angina pectoris: Secondary | ICD-10-CM | POA: Diagnosis not present

## 2021-01-26 DIAGNOSIS — I10 Essential (primary) hypertension: Secondary | ICD-10-CM | POA: Diagnosis not present

## 2021-01-26 DIAGNOSIS — E038 Other specified hypothyroidism: Secondary | ICD-10-CM | POA: Diagnosis not present

## 2021-01-26 DIAGNOSIS — E785 Hyperlipidemia, unspecified: Secondary | ICD-10-CM | POA: Diagnosis not present

## 2021-01-26 DIAGNOSIS — E559 Vitamin D deficiency, unspecified: Secondary | ICD-10-CM | POA: Diagnosis not present

## 2021-01-26 DIAGNOSIS — E1165 Type 2 diabetes mellitus with hyperglycemia: Secondary | ICD-10-CM | POA: Diagnosis not present

## 2021-01-26 DIAGNOSIS — E782 Mixed hyperlipidemia: Secondary | ICD-10-CM | POA: Diagnosis not present

## 2021-02-22 DIAGNOSIS — E782 Mixed hyperlipidemia: Secondary | ICD-10-CM | POA: Diagnosis not present

## 2021-02-22 DIAGNOSIS — D511 Vitamin B12 deficiency anemia due to selective vitamin B12 malabsorption with proteinuria: Secondary | ICD-10-CM | POA: Diagnosis not present

## 2021-02-22 DIAGNOSIS — E559 Vitamin D deficiency, unspecified: Secondary | ICD-10-CM | POA: Diagnosis not present

## 2021-02-22 DIAGNOSIS — I2510 Atherosclerotic heart disease of native coronary artery without angina pectoris: Secondary | ICD-10-CM | POA: Diagnosis not present

## 2021-02-22 DIAGNOSIS — D518 Other vitamin B12 deficiency anemias: Secondary | ICD-10-CM | POA: Diagnosis not present

## 2021-02-22 DIAGNOSIS — E785 Hyperlipidemia, unspecified: Secondary | ICD-10-CM | POA: Diagnosis not present

## 2021-02-22 DIAGNOSIS — E1165 Type 2 diabetes mellitus with hyperglycemia: Secondary | ICD-10-CM | POA: Diagnosis not present

## 2021-02-22 DIAGNOSIS — E119 Type 2 diabetes mellitus without complications: Secondary | ICD-10-CM | POA: Diagnosis not present

## 2021-02-22 DIAGNOSIS — I10 Essential (primary) hypertension: Secondary | ICD-10-CM | POA: Diagnosis not present

## 2021-02-22 DIAGNOSIS — E038 Other specified hypothyroidism: Secondary | ICD-10-CM | POA: Diagnosis not present

## 2021-03-27 DIAGNOSIS — E1165 Type 2 diabetes mellitus with hyperglycemia: Secondary | ICD-10-CM | POA: Diagnosis not present

## 2021-03-31 DIAGNOSIS — E785 Hyperlipidemia, unspecified: Secondary | ICD-10-CM | POA: Diagnosis not present

## 2021-03-31 DIAGNOSIS — E559 Vitamin D deficiency, unspecified: Secondary | ICD-10-CM | POA: Diagnosis not present

## 2021-03-31 DIAGNOSIS — E119 Type 2 diabetes mellitus without complications: Secondary | ICD-10-CM | POA: Diagnosis not present

## 2021-03-31 DIAGNOSIS — I2510 Atherosclerotic heart disease of native coronary artery without angina pectoris: Secondary | ICD-10-CM | POA: Diagnosis not present

## 2021-03-31 DIAGNOSIS — E1165 Type 2 diabetes mellitus with hyperglycemia: Secondary | ICD-10-CM | POA: Diagnosis not present

## 2021-03-31 DIAGNOSIS — D518 Other vitamin B12 deficiency anemias: Secondary | ICD-10-CM | POA: Diagnosis not present

## 2021-03-31 DIAGNOSIS — E038 Other specified hypothyroidism: Secondary | ICD-10-CM | POA: Diagnosis not present

## 2021-03-31 DIAGNOSIS — I10 Essential (primary) hypertension: Secondary | ICD-10-CM | POA: Diagnosis not present

## 2021-03-31 DIAGNOSIS — E782 Mixed hyperlipidemia: Secondary | ICD-10-CM | POA: Diagnosis not present

## 2021-03-31 DIAGNOSIS — D511 Vitamin B12 deficiency anemia due to selective vitamin B12 malabsorption with proteinuria: Secondary | ICD-10-CM | POA: Diagnosis not present

## 2021-04-01 DIAGNOSIS — E119 Type 2 diabetes mellitus without complications: Secondary | ICD-10-CM | POA: Diagnosis not present

## 2021-04-01 DIAGNOSIS — I10 Essential (primary) hypertension: Secondary | ICD-10-CM | POA: Diagnosis not present

## 2021-04-01 DIAGNOSIS — E038 Other specified hypothyroidism: Secondary | ICD-10-CM | POA: Diagnosis not present

## 2021-04-01 DIAGNOSIS — E782 Mixed hyperlipidemia: Secondary | ICD-10-CM | POA: Diagnosis not present

## 2021-04-01 DIAGNOSIS — K219 Gastro-esophageal reflux disease without esophagitis: Secondary | ICD-10-CM | POA: Diagnosis not present

## 2021-04-01 DIAGNOSIS — D518 Other vitamin B12 deficiency anemias: Secondary | ICD-10-CM | POA: Diagnosis not present

## 2021-04-01 DIAGNOSIS — E559 Vitamin D deficiency, unspecified: Secondary | ICD-10-CM | POA: Diagnosis not present

## 2021-04-01 DIAGNOSIS — Z5181 Encounter for therapeutic drug level monitoring: Secondary | ICD-10-CM | POA: Diagnosis not present

## 2021-04-11 ENCOUNTER — Encounter: Payer: Self-pay | Admitting: Internal Medicine

## 2021-04-12 ENCOUNTER — Ambulatory Visit (INDEPENDENT_AMBULATORY_CARE_PROVIDER_SITE_OTHER): Payer: PPO

## 2021-04-12 DIAGNOSIS — I441 Atrioventricular block, second degree: Secondary | ICD-10-CM

## 2021-04-12 LAB — CUP PACEART REMOTE DEVICE CHECK
Battery Remaining Longevity: 95 mo
Battery Remaining Percentage: 77 %
Battery Voltage: 3.01 V
Brady Statistic AP VP Percent: 92 %
Brady Statistic AP VS Percent: 1 %
Brady Statistic AS VP Percent: 7.6 %
Brady Statistic AS VS Percent: 1 %
Brady Statistic RA Percent Paced: 90 %
Brady Statistic RV Percent Paced: 99 %
Date Time Interrogation Session: 20230207094947
Implantable Lead Implant Date: 20200731
Implantable Lead Implant Date: 20200731
Implantable Lead Location: 753859
Implantable Lead Location: 753860
Implantable Pulse Generator Implant Date: 20200731
Lead Channel Impedance Value: 440 Ohm
Lead Channel Impedance Value: 730 Ohm
Lead Channel Pacing Threshold Amplitude: 0.5 V
Lead Channel Pacing Threshold Amplitude: 0.625 V
Lead Channel Pacing Threshold Pulse Width: 0.4 ms
Lead Channel Pacing Threshold Pulse Width: 0.5 ms
Lead Channel Sensing Intrinsic Amplitude: 12 mV
Lead Channel Sensing Intrinsic Amplitude: 5 mV
Lead Channel Setting Pacing Amplitude: 0.75 V
Lead Channel Setting Pacing Amplitude: 1.625
Lead Channel Setting Pacing Pulse Width: 0.5 ms
Lead Channel Setting Sensing Sensitivity: 2 mV
Pulse Gen Model: 2272
Pulse Gen Serial Number: 9152674

## 2021-04-15 DIAGNOSIS — J929 Pleural plaque without asbestos: Secondary | ICD-10-CM | POA: Diagnosis not present

## 2021-04-15 DIAGNOSIS — R051 Acute cough: Secondary | ICD-10-CM | POA: Diagnosis not present

## 2021-04-15 DIAGNOSIS — J40 Bronchitis, not specified as acute or chronic: Secondary | ICD-10-CM | POA: Diagnosis not present

## 2021-04-15 DIAGNOSIS — I2510 Atherosclerotic heart disease of native coronary artery without angina pectoris: Secondary | ICD-10-CM | POA: Diagnosis not present

## 2021-04-15 DIAGNOSIS — J984 Other disorders of lung: Secondary | ICD-10-CM | POA: Diagnosis not present

## 2021-04-15 DIAGNOSIS — Z87891 Personal history of nicotine dependence: Secondary | ICD-10-CM | POA: Diagnosis not present

## 2021-04-15 NOTE — Progress Notes (Signed)
Remote pacemaker transmission.   

## 2021-04-22 DIAGNOSIS — I10 Essential (primary) hypertension: Secondary | ICD-10-CM | POA: Diagnosis not present

## 2021-04-22 DIAGNOSIS — D511 Vitamin B12 deficiency anemia due to selective vitamin B12 malabsorption with proteinuria: Secondary | ICD-10-CM | POA: Diagnosis not present

## 2021-04-22 DIAGNOSIS — E1165 Type 2 diabetes mellitus with hyperglycemia: Secondary | ICD-10-CM | POA: Diagnosis not present

## 2021-04-22 DIAGNOSIS — D518 Other vitamin B12 deficiency anemias: Secondary | ICD-10-CM | POA: Diagnosis not present

## 2021-04-22 DIAGNOSIS — E785 Hyperlipidemia, unspecified: Secondary | ICD-10-CM | POA: Diagnosis not present

## 2021-04-22 DIAGNOSIS — I2510 Atherosclerotic heart disease of native coronary artery without angina pectoris: Secondary | ICD-10-CM | POA: Diagnosis not present

## 2021-04-22 DIAGNOSIS — E782 Mixed hyperlipidemia: Secondary | ICD-10-CM | POA: Diagnosis not present

## 2021-04-22 DIAGNOSIS — E119 Type 2 diabetes mellitus without complications: Secondary | ICD-10-CM | POA: Diagnosis not present

## 2021-04-22 DIAGNOSIS — E038 Other specified hypothyroidism: Secondary | ICD-10-CM | POA: Diagnosis not present

## 2021-04-22 DIAGNOSIS — E559 Vitamin D deficiency, unspecified: Secondary | ICD-10-CM | POA: Diagnosis not present

## 2021-04-28 DIAGNOSIS — I10 Essential (primary) hypertension: Secondary | ICD-10-CM

## 2021-04-28 DIAGNOSIS — K219 Gastro-esophageal reflux disease without esophagitis: Secondary | ICD-10-CM

## 2021-04-28 DIAGNOSIS — E1165 Type 2 diabetes mellitus with hyperglycemia: Secondary | ICD-10-CM

## 2021-04-28 DIAGNOSIS — E291 Testicular hypofunction: Secondary | ICD-10-CM

## 2021-04-28 DIAGNOSIS — E559 Vitamin D deficiency, unspecified: Secondary | ICD-10-CM

## 2021-04-28 DIAGNOSIS — I6523 Occlusion and stenosis of bilateral carotid arteries: Secondary | ICD-10-CM

## 2021-04-28 DIAGNOSIS — E039 Hypothyroidism, unspecified: Secondary | ICD-10-CM

## 2021-04-28 DIAGNOSIS — D518 Other vitamin B12 deficiency anemias: Secondary | ICD-10-CM | POA: Insufficient documentation

## 2021-04-28 DIAGNOSIS — J309 Allergic rhinitis, unspecified: Secondary | ICD-10-CM

## 2021-04-28 DIAGNOSIS — R100 Acute abdomen: Secondary | ICD-10-CM | POA: Insufficient documentation

## 2021-04-28 DIAGNOSIS — S39012A Strain of muscle, fascia and tendon of lower back, initial encounter: Secondary | ICD-10-CM

## 2021-04-28 DIAGNOSIS — F17200 Nicotine dependence, unspecified, uncomplicated: Secondary | ICD-10-CM

## 2021-04-28 DIAGNOSIS — Z5181 Encounter for therapeutic drug level monitoring: Secondary | ICD-10-CM

## 2021-04-28 DIAGNOSIS — J329 Chronic sinusitis, unspecified: Secondary | ICD-10-CM

## 2021-04-28 DIAGNOSIS — D511 Vitamin B12 deficiency anemia due to selective vitamin B12 malabsorption with proteinuria: Secondary | ICD-10-CM

## 2021-04-28 DIAGNOSIS — R110 Nausea: Secondary | ICD-10-CM | POA: Insufficient documentation

## 2021-04-28 HISTORY — DX: Gastro-esophageal reflux disease without esophagitis: K21.9

## 2021-04-28 HISTORY — DX: Other vitamin B12 deficiency anemias: D51.8

## 2021-04-28 HISTORY — DX: Allergic rhinitis, unspecified: J30.9

## 2021-04-28 HISTORY — DX: Testicular hypofunction: E29.1

## 2021-04-28 HISTORY — DX: Essential (primary) hypertension: I10

## 2021-04-28 HISTORY — DX: Encounter for therapeutic drug level monitoring: Z51.81

## 2021-04-28 HISTORY — DX: Vitamin D deficiency, unspecified: E55.9

## 2021-04-28 HISTORY — DX: Acute abdomen: R10.0

## 2021-04-28 HISTORY — DX: Nicotine dependence, unspecified, uncomplicated: F17.200

## 2021-04-28 HISTORY — DX: Type 2 diabetes mellitus with hyperglycemia: E11.65

## 2021-04-28 HISTORY — DX: Occlusion and stenosis of bilateral carotid arteries: I65.23

## 2021-04-28 HISTORY — DX: Nausea: R11.0

## 2021-04-28 HISTORY — DX: Hypothyroidism, unspecified: E03.9

## 2021-04-28 HISTORY — DX: Vitamin B12 deficiency anemia due to selective vitamin B12 malabsorption with proteinuria: D51.1

## 2021-04-28 HISTORY — DX: Chronic sinusitis, unspecified: J32.9

## 2021-04-28 HISTORY — DX: Strain of muscle, fascia and tendon of lower back, initial encounter: S39.012A

## 2021-05-14 DIAGNOSIS — E1165 Type 2 diabetes mellitus with hyperglycemia: Secondary | ICD-10-CM | POA: Diagnosis not present

## 2021-05-19 DIAGNOSIS — I10 Essential (primary) hypertension: Secondary | ICD-10-CM | POA: Diagnosis not present

## 2021-05-19 DIAGNOSIS — E782 Mixed hyperlipidemia: Secondary | ICD-10-CM | POA: Diagnosis not present

## 2021-05-19 DIAGNOSIS — D518 Other vitamin B12 deficiency anemias: Secondary | ICD-10-CM | POA: Diagnosis not present

## 2021-05-19 DIAGNOSIS — E119 Type 2 diabetes mellitus without complications: Secondary | ICD-10-CM | POA: Diagnosis not present

## 2021-05-19 DIAGNOSIS — E038 Other specified hypothyroidism: Secondary | ICD-10-CM | POA: Diagnosis not present

## 2021-05-19 DIAGNOSIS — E1165 Type 2 diabetes mellitus with hyperglycemia: Secondary | ICD-10-CM | POA: Diagnosis not present

## 2021-05-19 DIAGNOSIS — E785 Hyperlipidemia, unspecified: Secondary | ICD-10-CM | POA: Diagnosis not present

## 2021-05-19 DIAGNOSIS — I2510 Atherosclerotic heart disease of native coronary artery without angina pectoris: Secondary | ICD-10-CM | POA: Diagnosis not present

## 2021-05-19 DIAGNOSIS — D511 Vitamin B12 deficiency anemia due to selective vitamin B12 malabsorption with proteinuria: Secondary | ICD-10-CM | POA: Diagnosis not present

## 2021-05-19 DIAGNOSIS — E559 Vitamin D deficiency, unspecified: Secondary | ICD-10-CM | POA: Diagnosis not present

## 2021-05-22 DIAGNOSIS — E1165 Type 2 diabetes mellitus with hyperglycemia: Secondary | ICD-10-CM | POA: Diagnosis not present

## 2021-05-23 ENCOUNTER — Institutional Professional Consult (permissible substitution): Payer: PPO | Admitting: Surgical

## 2021-05-23 ENCOUNTER — Other Ambulatory Visit: Payer: Self-pay

## 2021-05-23 VITALS — BP 153/77 | HR 78 | Resp 20 | Ht 68.0 in

## 2021-05-23 DIAGNOSIS — I7121 Aneurysm of the ascending aorta, without rupture: Secondary | ICD-10-CM | POA: Diagnosis not present

## 2021-05-23 NOTE — Patient Instructions (Signed)
Activity, lifestyle and medication management for multiple comorbidities discussed with patient ?

## 2021-05-23 NOTE — Progress Notes (Signed)
? ? ? ?Subjective:  ? ? Patient ID: Jay Hanson, male    DOB: [birth year], 71 y.o.   MRN: [id number] ? ?Chief Complaint  ?Patient presents with  ? Thoracic Aortic Aneurysm  ?  Initial surgical consult, CT 04/15/21  ? ? ?HPI ?Patient is in today for a 4.1 cm ascending aortic aneurysm found on recent CT scan.  Patient's brother recently was found to have lung cancer which prompted him to get a CT of his chest and this was found.  The patient has multiple significant cardiac risk factors including previous CAD with STEMI in 2014 including cardiac arrest with stent placed at that time in the circumflex as well as diabetes mellitus.  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  He does not have symptoms of chest pain or shortness of breath.  He has a pacemaker but no recent palpitations.  Pacer was placed for junctional heart rhythm in the 40s to 50s.  Most recent hemoglobin A1c in 2020 in epic is 9.0 but he says a more recent 1 was 8.0. ? ?Past Medical History:  ?Diagnosis Date  ? Bradycardia 10/04/2018  ? Coronary artery disease   ? Diabetes mellitus without complication ([place])   ? MI (myocardial infarction) ([place])   ? ? ?Past Surgical History:  ?Procedure Laterality Date  ? CARDIAC SURGERY    ? COLONOSCOPY    ? PACEMAKER IMPLANT N/A 10/04/2018  ? Procedure: PACEMAKER IMPLANT;  Surgeon: Constance Haw, MD;  Location: [place] [facility];  Service: Cardiovascular;  Laterality: N/A;  ? ? ?Family History  ?Problem Relation Age of Onset  ? Heart disease Brother   ? ? ?Social History  ? ?Socioeconomic History  ? Marital status: Married  ?  Spouse name: Not on file  ? Number of children: Not on file  ? Years of education: Not on file  ? Highest education level: Not on file  ?Occupational History  ? Not on file  ?Tobacco Use  ? Smoking status: Former  ? Smokeless tobacco: Never  ?Vaping Use  ? Vaping Use: Never used  ?Substance and Sexual Activity  ? Alcohol use: Never  ? Drug use: Never  ?  Sexual activity: Not on file  ?Other Topics Concern  ? Not on file  ?Social History Narrative  ? ** Merged History Encounter **  ?    ? ?Social Determinants of Health  ? ?Financial Resource Strain: Not on file  ?Food Insecurity: Not on file  ?Transportation Needs: Not on file  ?Physical Activity: Not on file  ?Stress: Not on file  ?Social Connections: Not on file  ?Intimate Partner Violence: Not on file  ? ? ?Outpatient Medications Prior to Visit  ?Medication Sig Dispense Refill  ? aspirin EC 81 MG tablet Take 81 mg by mouth daily.    ? Cholecalciferol (VITAMIN D3) 125 MCG (5000 UT) TABS Take 5,000 Units by mouth daily.    ? clopidogrel (PLAVIX) 75 MG tablet Take 75 mg by mouth daily.    ? glucose blood test strip USE TO CHECK GLUCOSE 4 TIMES DAILY    ? insulin aspart protamine- aspart (NOVOLOG MIX 70/30) (70-30) 100 UNIT/ML injection Inject 0-100 Units into the skin as needed (for blood sugar).    ? insulin detemir (LEVEMIR) 100 UNIT/ML injection Inject 60 Units into the skin 2 (two) times daily.     ? levocetirizine (XYZAL) 5 MG tablet Take 5 mg by mouth every evening.    ?  pantoprazole (PROTONIX) 40 MG tablet Take 40 mg by mouth daily.    ? fluticasone (FLONASE) 50 MCG/ACT nasal spray Place 1 spray into both nostrils daily as needed for allergies.  2  ? carvedilol (COREG) 6.25 MG tablet Take 3.125 mg by mouth in the morning, at noon, in the evening, and at bedtime. (Patient not taking: Reported on 05/23/2021)    ? cetirizine (ZYRTEC) 10 MG tablet Take 10 mg by mouth daily.     ? sitaGLIPtin (JANUVIA) 100 MG tablet Take 100 mg by mouth daily. (Patient not taking: Reported on 05/23/2021)    ? ?No facility-administered medications prior to visit.  ? ? ?Allergies  ?Allergen Reactions  ? Azithromycin Other (See Comments)  ?  Unknown-pt does not remember reaction  ? Canagliflozin Nausea And Vomiting  ? ? ?ROS: Admits to musculoskeletal symptoms including arthritis but otherwise denies ? ?   ?Objective:  ?  ?Physical  Exam ?Constitutional:   ?   Appearance: Normal appearance. He is obese. He is not ill-appearing.  ?HENT:  ?   Head: Normocephalic and atraumatic.  ?Cardiovascular:  ?   Rate and Rhythm: Normal rate and regular rhythm.  ?   Pulses: Normal pulses.  ?   Heart sounds: Normal heart sounds. No murmur heard. ?  No gallop.  ?Pulmonary:  ?   Effort: Pulmonary effort is normal.  ?   Breath sounds: Normal breath sounds.  ?Abdominal:  ?   Tenderness: There is no abdominal tenderness.  ?   Comments: Obese  ?Musculoskeletal:     ?   General: No swelling.  ?Skin: ?   Capillary Refill: Capillary refill takes less than 2 seconds.  ?   Coloration: Skin is not jaundiced or pale.  ?Neurological:  ?   General: No focal deficit present.  ?   Mental Status: He is alert.  ?Psychiatric:     ?   Mood and Affect: Mood normal.     ?   Behavior: Behavior normal.  ? ? ?BP (!) 153/77 (BP Location: Left Arm, Patient Position: Sitting)   Pulse 78   Resp 20   Ht 5\' 8"  (1.727 m)   SpO2 97% Comment: RA  BMI 38.53 kg/m?  ?Wt Readings from Last 3 Encounters:  ?11/22/20 253 lb 6.4 oz (114.9 kg)  ?11/06/19 254 lb (115.2 kg)  ?01/06/19 240 lb (108.9 kg)  ? ? ?Health Maintenance Due  ?Topic Date Due  ? COVID-19 Vaccine (1) Never done  ? FOOT EXAM  Never done  ? OPHTHALMOLOGY EXAM  Never done  ? URINE MICROALBUMIN  Never done  ? Hepatitis C Screening  Never done  ? TETANUS/TDAP  Never done  ? Zoster Vaccines- Shingrix (1 of 2) Never done  ? COLONOSCOPY (Pts 45-36yrs Insurance coverage will need to be confirmed)  Never done  ? Pneumonia Vaccine 32+ Years old (1 - PCV) Never done  ? HEMOGLOBIN A1C  04/06/2019  ? INFLUENZA VACCINE  10/04/2020  ? ? ?There are no preventive care reminders to display for this patient. ? ? ?Lab Results  ?Component Value Date  ? TSH 2.270 10/04/2018  ? ?Lab Results  ?Component Value Date  ? WBC 8.8 10/03/2018  ? HGB 14.5 10/03/2018  ? HCT 43.7 10/03/2018  ? MCV 91.8 10/03/2018  ? PLT 238 10/03/2018  ? ?Lab Results  ?Component  Value Date  ? NA 135 10/03/2018  ? K 4.4 10/03/2018  ? CO2 20 (L) 10/03/2018  ? GLUCOSE 269 (H) 10/03/2018  ?  BUN 20 10/03/2018  ? CREATININE 1.04 10/03/2018  ? CALCIUM 9.2 10/03/2018  ? ANIONGAP 7 10/03/2018  ? ?No results found for: CHOL ?No results found for: HDL ?No results found for: [place] ?No results found for: TRIG ?No results found for: CHOLHDL ?Lab Results  ?Component Value Date  ? HGBA1C 9.0 (H) 10/04/2018  ? ? ?   ?Assessment & Plan:  ? ?Problem List Items Addressed This Visit   ?None ?Visit Diagnoses   ? ? Aneurysm of ascending aorta without rupture    -  Primary  ? ?  ?A/P: 4.1 cm ascending thoracic aortic aneurysm.  Multiple cardiac risk factors and comorbidities.  We discussed lifestyle, diet, and medications and the importance of controlling all his risk factors to the best of ability.  We will see the patient in 1 year with a repeat CTA of the chest for aneurysm surveillance.  He will continue to follow-up with his primary as well as cardiology.  I have encouraged him to obtain a home blood pressure monitor as his blood pressure was elevated today.  He states that his usually in the "normal" range.  He does understand the importance of following this carefully. ? ? ?No orders of the defined types were placed in this encounter. ? ? ? ?John Giovanni, PA-C ? ?

## 2021-05-24 ENCOUNTER — Encounter: Payer: Self-pay | Admitting: *Deleted

## 2021-06-13 DIAGNOSIS — D518 Other vitamin B12 deficiency anemias: Secondary | ICD-10-CM | POA: Diagnosis not present

## 2021-06-13 DIAGNOSIS — I10 Essential (primary) hypertension: Secondary | ICD-10-CM | POA: Diagnosis not present

## 2021-06-13 DIAGNOSIS — I2510 Atherosclerotic heart disease of native coronary artery without angina pectoris: Secondary | ICD-10-CM | POA: Diagnosis not present

## 2021-06-13 DIAGNOSIS — E559 Vitamin D deficiency, unspecified: Secondary | ICD-10-CM | POA: Diagnosis not present

## 2021-06-13 DIAGNOSIS — E1165 Type 2 diabetes mellitus with hyperglycemia: Secondary | ICD-10-CM | POA: Diagnosis not present

## 2021-06-13 DIAGNOSIS — E782 Mixed hyperlipidemia: Secondary | ICD-10-CM | POA: Diagnosis not present

## 2021-06-13 DIAGNOSIS — D511 Vitamin B12 deficiency anemia due to selective vitamin B12 malabsorption with proteinuria: Secondary | ICD-10-CM | POA: Diagnosis not present

## 2021-06-13 DIAGNOSIS — E785 Hyperlipidemia, unspecified: Secondary | ICD-10-CM | POA: Diagnosis not present

## 2021-06-13 DIAGNOSIS — E038 Other specified hypothyroidism: Secondary | ICD-10-CM | POA: Diagnosis not present

## 2021-06-13 DIAGNOSIS — E119 Type 2 diabetes mellitus without complications: Secondary | ICD-10-CM | POA: Diagnosis not present

## 2021-06-24 DIAGNOSIS — I10 Essential (primary) hypertension: Secondary | ICD-10-CM | POA: Diagnosis not present

## 2021-06-24 DIAGNOSIS — E559 Vitamin D deficiency, unspecified: Secondary | ICD-10-CM | POA: Diagnosis not present

## 2021-06-24 DIAGNOSIS — Z0000 Encounter for general adult medical examination without abnormal findings: Secondary | ICD-10-CM | POA: Diagnosis not present

## 2021-06-24 DIAGNOSIS — K219 Gastro-esophageal reflux disease without esophagitis: Secondary | ICD-10-CM | POA: Diagnosis not present

## 2021-06-24 DIAGNOSIS — E1165 Type 2 diabetes mellitus with hyperglycemia: Secondary | ICD-10-CM | POA: Diagnosis not present

## 2021-07-12 ENCOUNTER — Ambulatory Visit (INDEPENDENT_AMBULATORY_CARE_PROVIDER_SITE_OTHER): Payer: PPO

## 2021-07-12 DIAGNOSIS — I441 Atrioventricular block, second degree: Secondary | ICD-10-CM | POA: Diagnosis not present

## 2021-07-12 LAB — CUP PACEART REMOTE DEVICE CHECK
Battery Remaining Longevity: 91 mo
Battery Remaining Percentage: 74 %
Battery Voltage: 3.01 V
Brady Statistic AP VP Percent: 92 %
Brady Statistic AP VS Percent: 1 %
Brady Statistic AS VP Percent: 7.3 %
Brady Statistic AS VS Percent: 1 %
Brady Statistic RA Percent Paced: 91 %
Brady Statistic RV Percent Paced: 99 %
Date Time Interrogation Session: 20230509032748
Implantable Lead Implant Date: 20200731
Implantable Lead Implant Date: 20200731
Implantable Lead Location: 753859
Implantable Lead Location: 753860
Implantable Pulse Generator Implant Date: 20200731
Lead Channel Impedance Value: 440 Ohm
Lead Channel Impedance Value: 700 Ohm
Lead Channel Pacing Threshold Amplitude: 0.5 V
Lead Channel Pacing Threshold Amplitude: 0.625 V
Lead Channel Pacing Threshold Pulse Width: 0.4 ms
Lead Channel Pacing Threshold Pulse Width: 0.5 ms
Lead Channel Sensing Intrinsic Amplitude: 4.7 mV
Lead Channel Sensing Intrinsic Amplitude: 9.6 mV
Lead Channel Setting Pacing Amplitude: 0.75 V
Lead Channel Setting Pacing Amplitude: 1.625
Lead Channel Setting Pacing Pulse Width: 0.5 ms
Lead Channel Setting Sensing Sensitivity: 2 mV
Pulse Gen Model: 2272
Pulse Gen Serial Number: 9152674

## 2021-07-26 NOTE — Progress Notes (Signed)
Remote pacemaker transmission.   

## 2021-08-03 DIAGNOSIS — E119 Type 2 diabetes mellitus without complications: Secondary | ICD-10-CM | POA: Diagnosis not present

## 2021-08-03 DIAGNOSIS — E782 Mixed hyperlipidemia: Secondary | ICD-10-CM | POA: Diagnosis not present

## 2021-08-03 DIAGNOSIS — E785 Hyperlipidemia, unspecified: Secondary | ICD-10-CM | POA: Diagnosis not present

## 2021-08-03 DIAGNOSIS — D511 Vitamin B12 deficiency anemia due to selective vitamin B12 malabsorption with proteinuria: Secondary | ICD-10-CM | POA: Diagnosis not present

## 2021-08-03 DIAGNOSIS — E559 Vitamin D deficiency, unspecified: Secondary | ICD-10-CM | POA: Diagnosis not present

## 2021-08-03 DIAGNOSIS — I2510 Atherosclerotic heart disease of native coronary artery without angina pectoris: Secondary | ICD-10-CM | POA: Diagnosis not present

## 2021-08-03 DIAGNOSIS — I10 Essential (primary) hypertension: Secondary | ICD-10-CM | POA: Diagnosis not present

## 2021-08-03 DIAGNOSIS — D518 Other vitamin B12 deficiency anemias: Secondary | ICD-10-CM | POA: Diagnosis not present

## 2021-08-03 DIAGNOSIS — E1165 Type 2 diabetes mellitus with hyperglycemia: Secondary | ICD-10-CM | POA: Diagnosis not present

## 2021-08-03 DIAGNOSIS — E038 Other specified hypothyroidism: Secondary | ICD-10-CM | POA: Diagnosis not present

## 2021-08-24 DIAGNOSIS — E782 Mixed hyperlipidemia: Secondary | ICD-10-CM | POA: Diagnosis not present

## 2021-08-24 DIAGNOSIS — E559 Vitamin D deficiency, unspecified: Secondary | ICD-10-CM | POA: Diagnosis not present

## 2021-08-24 DIAGNOSIS — I10 Essential (primary) hypertension: Secondary | ICD-10-CM | POA: Diagnosis not present

## 2021-08-24 DIAGNOSIS — E038 Other specified hypothyroidism: Secondary | ICD-10-CM | POA: Diagnosis not present

## 2021-08-24 DIAGNOSIS — E785 Hyperlipidemia, unspecified: Secondary | ICD-10-CM | POA: Diagnosis not present

## 2021-08-24 DIAGNOSIS — D518 Other vitamin B12 deficiency anemias: Secondary | ICD-10-CM | POA: Diagnosis not present

## 2021-08-24 DIAGNOSIS — I2510 Atherosclerotic heart disease of native coronary artery without angina pectoris: Secondary | ICD-10-CM | POA: Diagnosis not present

## 2021-08-24 DIAGNOSIS — E1165 Type 2 diabetes mellitus with hyperglycemia: Secondary | ICD-10-CM | POA: Diagnosis not present

## 2021-08-24 DIAGNOSIS — D511 Vitamin B12 deficiency anemia due to selective vitamin B12 malabsorption with proteinuria: Secondary | ICD-10-CM | POA: Diagnosis not present

## 2021-08-24 DIAGNOSIS — E119 Type 2 diabetes mellitus without complications: Secondary | ICD-10-CM | POA: Diagnosis not present

## 2021-09-23 DIAGNOSIS — E1165 Type 2 diabetes mellitus with hyperglycemia: Secondary | ICD-10-CM | POA: Diagnosis not present

## 2021-09-23 DIAGNOSIS — K219 Gastro-esophageal reflux disease without esophagitis: Secondary | ICD-10-CM | POA: Diagnosis not present

## 2021-09-23 DIAGNOSIS — I10 Essential (primary) hypertension: Secondary | ICD-10-CM | POA: Diagnosis not present

## 2021-10-03 DIAGNOSIS — I10 Essential (primary) hypertension: Secondary | ICD-10-CM | POA: Diagnosis not present

## 2021-10-03 DIAGNOSIS — E785 Hyperlipidemia, unspecified: Secondary | ICD-10-CM | POA: Diagnosis not present

## 2021-10-03 DIAGNOSIS — E038 Other specified hypothyroidism: Secondary | ICD-10-CM | POA: Diagnosis not present

## 2021-10-03 DIAGNOSIS — D518 Other vitamin B12 deficiency anemias: Secondary | ICD-10-CM | POA: Diagnosis not present

## 2021-10-03 DIAGNOSIS — E119 Type 2 diabetes mellitus without complications: Secondary | ICD-10-CM | POA: Diagnosis not present

## 2021-10-03 DIAGNOSIS — E782 Mixed hyperlipidemia: Secondary | ICD-10-CM | POA: Diagnosis not present

## 2021-10-03 DIAGNOSIS — I2510 Atherosclerotic heart disease of native coronary artery without angina pectoris: Secondary | ICD-10-CM | POA: Diagnosis not present

## 2021-10-03 DIAGNOSIS — D511 Vitamin B12 deficiency anemia due to selective vitamin B12 malabsorption with proteinuria: Secondary | ICD-10-CM | POA: Diagnosis not present

## 2021-10-03 DIAGNOSIS — E559 Vitamin D deficiency, unspecified: Secondary | ICD-10-CM | POA: Diagnosis not present

## 2021-10-03 DIAGNOSIS — E1165 Type 2 diabetes mellitus with hyperglycemia: Secondary | ICD-10-CM | POA: Diagnosis not present

## 2021-10-12 DIAGNOSIS — E559 Vitamin D deficiency, unspecified: Secondary | ICD-10-CM | POA: Diagnosis not present

## 2021-10-12 DIAGNOSIS — E782 Mixed hyperlipidemia: Secondary | ICD-10-CM | POA: Diagnosis not present

## 2021-10-12 DIAGNOSIS — I10 Essential (primary) hypertension: Secondary | ICD-10-CM | POA: Diagnosis not present

## 2021-10-12 DIAGNOSIS — E785 Hyperlipidemia, unspecified: Secondary | ICD-10-CM | POA: Diagnosis not present

## 2021-10-12 DIAGNOSIS — D518 Other vitamin B12 deficiency anemias: Secondary | ICD-10-CM | POA: Diagnosis not present

## 2021-10-12 DIAGNOSIS — D511 Vitamin B12 deficiency anemia due to selective vitamin B12 malabsorption with proteinuria: Secondary | ICD-10-CM | POA: Diagnosis not present

## 2021-10-12 DIAGNOSIS — E119 Type 2 diabetes mellitus without complications: Secondary | ICD-10-CM | POA: Diagnosis not present

## 2021-10-12 DIAGNOSIS — E1165 Type 2 diabetes mellitus with hyperglycemia: Secondary | ICD-10-CM | POA: Diagnosis not present

## 2021-10-12 DIAGNOSIS — I2510 Atherosclerotic heart disease of native coronary artery without angina pectoris: Secondary | ICD-10-CM | POA: Diagnosis not present

## 2021-10-12 DIAGNOSIS — E038 Other specified hypothyroidism: Secondary | ICD-10-CM | POA: Diagnosis not present

## 2021-11-23 ENCOUNTER — Ambulatory Visit (INDEPENDENT_AMBULATORY_CARE_PROVIDER_SITE_OTHER): Payer: PPO

## 2021-11-23 DIAGNOSIS — I441 Atrioventricular block, second degree: Secondary | ICD-10-CM

## 2021-11-24 LAB — CUP PACEART REMOTE DEVICE CHECK
Battery Remaining Longevity: 86 mo
Battery Remaining Percentage: 70 %
Battery Voltage: 3.01 V
Brady Statistic AP VP Percent: 91 %
Brady Statistic AP VS Percent: 1 %
Brady Statistic AS VP Percent: 8.7 %
Brady Statistic AS VS Percent: 1 %
Brady Statistic RA Percent Paced: 90 %
Brady Statistic RV Percent Paced: 99 %
Date Time Interrogation Session: 20230920184839
Implantable Lead Implant Date: 20200731
Implantable Lead Implant Date: 20200731
Implantable Lead Location: 753859
Implantable Lead Location: 753860
Implantable Pulse Generator Implant Date: 20200731
Lead Channel Impedance Value: 410 Ohm
Lead Channel Impedance Value: 710 Ohm
Lead Channel Pacing Threshold Amplitude: 0.5 V
Lead Channel Pacing Threshold Amplitude: 0.5 V
Lead Channel Pacing Threshold Pulse Width: 0.4 ms
Lead Channel Pacing Threshold Pulse Width: 0.5 ms
Lead Channel Sensing Intrinsic Amplitude: 10.6 mV
Lead Channel Sensing Intrinsic Amplitude: 3.6 mV
Lead Channel Setting Pacing Amplitude: 0.75 V
Lead Channel Setting Pacing Amplitude: 1.5 V
Lead Channel Setting Pacing Pulse Width: 0.5 ms
Lead Channel Setting Sensing Sensitivity: 2 mV
Pulse Gen Model: 2272
Pulse Gen Serial Number: 9152674

## 2021-11-29 DIAGNOSIS — E119 Type 2 diabetes mellitus without complications: Secondary | ICD-10-CM | POA: Diagnosis not present

## 2021-11-29 DIAGNOSIS — E785 Hyperlipidemia, unspecified: Secondary | ICD-10-CM | POA: Diagnosis not present

## 2021-11-29 DIAGNOSIS — E559 Vitamin D deficiency, unspecified: Secondary | ICD-10-CM | POA: Diagnosis not present

## 2021-11-29 DIAGNOSIS — E782 Mixed hyperlipidemia: Secondary | ICD-10-CM | POA: Diagnosis not present

## 2021-11-29 DIAGNOSIS — I2510 Atherosclerotic heart disease of native coronary artery without angina pectoris: Secondary | ICD-10-CM | POA: Diagnosis not present

## 2021-11-29 DIAGNOSIS — E038 Other specified hypothyroidism: Secondary | ICD-10-CM | POA: Diagnosis not present

## 2021-11-29 DIAGNOSIS — I10 Essential (primary) hypertension: Secondary | ICD-10-CM | POA: Diagnosis not present

## 2021-11-29 DIAGNOSIS — D511 Vitamin B12 deficiency anemia due to selective vitamin B12 malabsorption with proteinuria: Secondary | ICD-10-CM | POA: Diagnosis not present

## 2021-11-29 DIAGNOSIS — D518 Other vitamin B12 deficiency anemias: Secondary | ICD-10-CM | POA: Diagnosis not present

## 2021-11-29 DIAGNOSIS — E1165 Type 2 diabetes mellitus with hyperglycemia: Secondary | ICD-10-CM | POA: Diagnosis not present

## 2021-12-05 NOTE — Progress Notes (Signed)
Remote pacemaker transmission.   

## 2021-12-15 DIAGNOSIS — E559 Vitamin D deficiency, unspecified: Secondary | ICD-10-CM | POA: Diagnosis not present

## 2021-12-15 DIAGNOSIS — I10 Essential (primary) hypertension: Secondary | ICD-10-CM | POA: Diagnosis not present

## 2021-12-15 DIAGNOSIS — E1165 Type 2 diabetes mellitus with hyperglycemia: Secondary | ICD-10-CM | POA: Diagnosis not present

## 2021-12-15 DIAGNOSIS — I2510 Atherosclerotic heart disease of native coronary artery without angina pectoris: Secondary | ICD-10-CM | POA: Diagnosis not present

## 2021-12-15 DIAGNOSIS — K219 Gastro-esophageal reflux disease without esophagitis: Secondary | ICD-10-CM | POA: Diagnosis not present

## 2021-12-15 DIAGNOSIS — D518 Other vitamin B12 deficiency anemias: Secondary | ICD-10-CM | POA: Diagnosis not present

## 2021-12-15 DIAGNOSIS — E782 Mixed hyperlipidemia: Secondary | ICD-10-CM | POA: Diagnosis not present

## 2021-12-15 DIAGNOSIS — E038 Other specified hypothyroidism: Secondary | ICD-10-CM | POA: Diagnosis not present

## 2021-12-16 DIAGNOSIS — R0989 Other specified symptoms and signs involving the circulatory and respiratory systems: Secondary | ICD-10-CM | POA: Diagnosis not present

## 2021-12-16 DIAGNOSIS — I6523 Occlusion and stenosis of bilateral carotid arteries: Secondary | ICD-10-CM | POA: Diagnosis not present

## 2021-12-20 DIAGNOSIS — I77811 Abdominal aortic ectasia: Secondary | ICD-10-CM | POA: Diagnosis not present

## 2021-12-23 DIAGNOSIS — I70223 Atherosclerosis of native arteries of extremities with rest pain, bilateral legs: Secondary | ICD-10-CM | POA: Diagnosis not present

## 2022-01-03 DIAGNOSIS — D511 Vitamin B12 deficiency anemia due to selective vitamin B12 malabsorption with proteinuria: Secondary | ICD-10-CM | POA: Diagnosis not present

## 2022-01-03 DIAGNOSIS — E785 Hyperlipidemia, unspecified: Secondary | ICD-10-CM | POA: Diagnosis not present

## 2022-01-03 DIAGNOSIS — E038 Other specified hypothyroidism: Secondary | ICD-10-CM | POA: Diagnosis not present

## 2022-01-03 DIAGNOSIS — E559 Vitamin D deficiency, unspecified: Secondary | ICD-10-CM | POA: Diagnosis not present

## 2022-01-03 DIAGNOSIS — D518 Other vitamin B12 deficiency anemias: Secondary | ICD-10-CM | POA: Diagnosis not present

## 2022-01-03 DIAGNOSIS — I2510 Atherosclerotic heart disease of native coronary artery without angina pectoris: Secondary | ICD-10-CM | POA: Diagnosis not present

## 2022-01-03 DIAGNOSIS — E119 Type 2 diabetes mellitus without complications: Secondary | ICD-10-CM | POA: Diagnosis not present

## 2022-01-03 DIAGNOSIS — I10 Essential (primary) hypertension: Secondary | ICD-10-CM | POA: Diagnosis not present

## 2022-01-03 DIAGNOSIS — E782 Mixed hyperlipidemia: Secondary | ICD-10-CM | POA: Diagnosis not present

## 2022-01-03 DIAGNOSIS — E1165 Type 2 diabetes mellitus with hyperglycemia: Secondary | ICD-10-CM | POA: Diagnosis not present

## 2022-02-02 DIAGNOSIS — E1165 Type 2 diabetes mellitus with hyperglycemia: Secondary | ICD-10-CM | POA: Diagnosis not present

## 2022-02-02 DIAGNOSIS — E119 Type 2 diabetes mellitus without complications: Secondary | ICD-10-CM | POA: Diagnosis not present

## 2022-02-02 DIAGNOSIS — D518 Other vitamin B12 deficiency anemias: Secondary | ICD-10-CM | POA: Diagnosis not present

## 2022-02-02 DIAGNOSIS — D511 Vitamin B12 deficiency anemia due to selective vitamin B12 malabsorption with proteinuria: Secondary | ICD-10-CM | POA: Diagnosis not present

## 2022-02-02 DIAGNOSIS — E038 Other specified hypothyroidism: Secondary | ICD-10-CM | POA: Diagnosis not present

## 2022-02-02 DIAGNOSIS — I10 Essential (primary) hypertension: Secondary | ICD-10-CM | POA: Diagnosis not present

## 2022-02-02 DIAGNOSIS — E785 Hyperlipidemia, unspecified: Secondary | ICD-10-CM | POA: Diagnosis not present

## 2022-02-02 DIAGNOSIS — E782 Mixed hyperlipidemia: Secondary | ICD-10-CM | POA: Diagnosis not present

## 2022-02-02 DIAGNOSIS — I2510 Atherosclerotic heart disease of native coronary artery without angina pectoris: Secondary | ICD-10-CM | POA: Diagnosis not present

## 2022-02-02 DIAGNOSIS — E559 Vitamin D deficiency, unspecified: Secondary | ICD-10-CM | POA: Diagnosis not present

## 2022-02-22 ENCOUNTER — Ambulatory Visit (INDEPENDENT_AMBULATORY_CARE_PROVIDER_SITE_OTHER): Payer: PPO

## 2022-02-22 DIAGNOSIS — I441 Atrioventricular block, second degree: Secondary | ICD-10-CM

## 2022-02-22 LAB — CUP PACEART REMOTE DEVICE CHECK
Battery Remaining Longevity: 83 mo
Battery Remaining Percentage: 67 %
Battery Voltage: 3.01 V
Brady Statistic AP VP Percent: 91 %
Brady Statistic AP VS Percent: 1 %
Brady Statistic AS VP Percent: 8.9 %
Brady Statistic AS VS Percent: 1 %
Brady Statistic RA Percent Paced: 90 %
Brady Statistic RV Percent Paced: 99 %
Date Time Interrogation Session: 20231220020805
Implantable Lead Connection Status: 753985
Implantable Lead Connection Status: 753985
Implantable Lead Implant Date: 20200731
Implantable Lead Implant Date: 20200731
Implantable Lead Location: 753859
Implantable Lead Location: 753860
Implantable Pulse Generator Implant Date: 20200731
Lead Channel Impedance Value: 400 Ohm
Lead Channel Impedance Value: 700 Ohm
Lead Channel Pacing Threshold Amplitude: 0.5 V
Lead Channel Pacing Threshold Amplitude: 0.5 V
Lead Channel Pacing Threshold Pulse Width: 0.4 ms
Lead Channel Pacing Threshold Pulse Width: 0.5 ms
Lead Channel Sensing Intrinsic Amplitude: 12 mV
Lead Channel Sensing Intrinsic Amplitude: 5 mV
Lead Channel Setting Pacing Amplitude: 0.75 V
Lead Channel Setting Pacing Amplitude: 1.5 V
Lead Channel Setting Pacing Pulse Width: 0.5 ms
Lead Channel Setting Sensing Sensitivity: 2 mV
Pulse Gen Model: 2272
Pulse Gen Serial Number: 9152674

## 2022-03-05 DIAGNOSIS — E559 Vitamin D deficiency, unspecified: Secondary | ICD-10-CM | POA: Diagnosis not present

## 2022-03-05 DIAGNOSIS — E785 Hyperlipidemia, unspecified: Secondary | ICD-10-CM | POA: Diagnosis not present

## 2022-03-05 DIAGNOSIS — I2510 Atherosclerotic heart disease of native coronary artery without angina pectoris: Secondary | ICD-10-CM | POA: Diagnosis not present

## 2022-03-05 DIAGNOSIS — E1165 Type 2 diabetes mellitus with hyperglycemia: Secondary | ICD-10-CM | POA: Diagnosis not present

## 2022-03-05 DIAGNOSIS — E119 Type 2 diabetes mellitus without complications: Secondary | ICD-10-CM | POA: Diagnosis not present

## 2022-03-05 DIAGNOSIS — D518 Other vitamin B12 deficiency anemias: Secondary | ICD-10-CM | POA: Diagnosis not present

## 2022-03-05 DIAGNOSIS — I10 Essential (primary) hypertension: Secondary | ICD-10-CM | POA: Diagnosis not present

## 2022-03-05 DIAGNOSIS — D511 Vitamin B12 deficiency anemia due to selective vitamin B12 malabsorption with proteinuria: Secondary | ICD-10-CM | POA: Diagnosis not present

## 2022-03-05 DIAGNOSIS — E038 Other specified hypothyroidism: Secondary | ICD-10-CM | POA: Diagnosis not present

## 2022-03-05 DIAGNOSIS — E782 Mixed hyperlipidemia: Secondary | ICD-10-CM | POA: Diagnosis not present

## 2022-03-14 NOTE — Progress Notes (Unsigned)
    Electrophysiology Office Note Date: 03/14/2022  ID:  Jay Hanson, DOB 12-25-1950, MRN [id number]  PCP: Bonnita Nasuti, MD Primary Cardiologist: None Electrophysiologist: Will [name], MD   CC: Pacemaker follow-up  Jay Hanson is a 72 y.o. male seen today for Will [name], MD for routine electrophysiology followup. Since last being seen in our clinic the patient reports doing ***.  he denies chest pain, palpitations, dyspnea, PND, orthopnea, nausea, vomiting, dizziness, syncope, edema, weight gain, or early satiety.   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   Coronary artery disease    Diabetes mellitus without complication (HCC)    MI (myocardial infarction) ([place])     Current Outpatient Medications  Medication Instructions   aspirin EC 81 mg, Oral, Daily   carvedilol (COREG) 3.125 mg, 4 times daily   cetirizine (ZYRTEC) 10 mg, Oral, Daily   clopidogrel (PLAVIX) 75 mg, Oral, Daily   glucose blood test strip USE TO CHECK GLUCOSE 4 TIMES DAILY   insulin aspart protamine- aspart (NOVOLOG MIX 70/30) (70-30) 100 UNIT/ML injection 0-100 Units, Subcutaneous, As needed   insulin detemir (LEVEMIR) 60 Units, Subcutaneous, 2 times daily   levocetirizine (XYZAL) 5 mg, Oral, Every evening   pantoprazole (PROTONIX) 40 mg, Oral, Daily   sitaGLIPtin (JANUVIA) 100 mg, Daily   Vitamin D3 5,000 Units, Oral, Daily    Family History: Family History  Problem Relation Age of Onset   Heart disease Brother     Physical Exam: There were no vitals filed for this visit.   GEN- The patient is well appearing, alert and oriented x 3 today.   HEENT: normocephalic, atraumatic Lungs- Clear to ausculation bilaterally, normal work of breathing.  No wheezes, rales, rhonchi Heart- Regular rate and rhythm, no murmurs, rubs or gallops, PMI not laterally  displaced Extremities- No peripheral edema. no clubbing or cyanosis; DP/PT/radial pulses 2+ bilaterally Skin- warm and dry, no rash or lesion, PPM pocket well healed. Psych- euthymic mood, full affect   PPM Interrogation-  reviewed in detail today,  See PACEART report.  EKG:  EKG is ordered today. Personal review of ekg ordered today shows ***   Other studies Reviewed: Additional studies/ records that were reviewed today include: Previous EP office notes, Previous remote checks, Most recent labwork.   {Select studies to display:26339}  Assessment and Plan:  1. 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 CAD Denies s/s ischemia  3.HTN Stable on current regimen   Current medicines are reviewed at length with the patient today.    Labs/ tests ordered today include: *** No orders of the defined types were placed in this encounter.    Disposition:   Follow up with {EPPROVIDERS:28135} in {Blank single:19197::"2 weeks","4 weeks","3 months","6 months","12 months","as usual post gen change"}    Signed, Shirley Friar, PA-C  03/14/2022 9:35 AM  [facility] [address] [place] [address] [phone] (office) [phone] (fax)

## 2022-03-16 ENCOUNTER — Encounter: Payer: Self-pay | Admitting: Student

## 2022-03-16 ENCOUNTER — Ambulatory Visit: Payer: PPO | Attending: Student | Admitting: Student

## 2022-03-16 VITALS — BP 134/80 | HR 69 | Ht 68.0 in

## 2022-03-16 DIAGNOSIS — I441 Atrioventricular block, second degree: Secondary | ICD-10-CM

## 2022-03-16 DIAGNOSIS — I10 Essential (primary) hypertension: Secondary | ICD-10-CM | POA: Diagnosis not present

## 2022-03-16 DIAGNOSIS — I2510 Atherosclerotic heart disease of native coronary artery without angina pectoris: Secondary | ICD-10-CM

## 2022-03-16 LAB — CUP PACEART INCLINIC DEVICE CHECK
Battery Remaining Longevity: 78 mo
Battery Voltage: 3.01 V
Brady Statistic RA Percent Paced: 90 %
Brady Statistic RV Percent Paced: 99.76 %
Date Time Interrogation Session: 20240111111021
Implantable Lead Connection Status: 753985
Implantable Lead Connection Status: 753985
Implantable Lead Implant Date: 20200731
Implantable Lead Implant Date: 20200731
Implantable Lead Location: 753859
Implantable Lead Location: 753860
Implantable Pulse Generator Implant Date: 20200731
Lead Channel Impedance Value: 412.5 Ohm
Lead Channel Impedance Value: 775 Ohm
Lead Channel Pacing Threshold Amplitude: 0.5 V
Lead Channel Pacing Threshold Amplitude: 0.5 V
Lead Channel Pacing Threshold Pulse Width: 0.4 ms
Lead Channel Pacing Threshold Pulse Width: 0.5 ms
Lead Channel Sensing Intrinsic Amplitude: 12 mV
Lead Channel Sensing Intrinsic Amplitude: 4.7 mV
Lead Channel Setting Pacing Amplitude: 0.75 V
Lead Channel Setting Pacing Amplitude: 1.5 V
Lead Channel Setting Pacing Pulse Width: 0.5 ms
Lead Channel Setting Sensing Sensitivity: 2 mV
Pulse Gen Model: 2272
Pulse Gen Serial Number: 9152674

## 2022-03-16 LAB — CBC

## 2022-03-16 NOTE — Patient Instructions (Signed)
Medication Instructions:  Your physician recommends that you continue on your current medications as directed. Please refer to the Current Medication list given to you today.  *If you need a refill on your cardiac medications before your next appointment, please call your pharmacy*   Lab Work: TODAY: BMET, CBC  If you have labs (blood work) drawn today and your tests are completely normal, you will receive your results only by: [place] (if you have MyChart) OR A paper copy in the mail If you have any lab test that is abnormal or we need to change your treatment, we will call you to review the results.   Follow-Up: At [facility], you and your health needs are our priority.  As part of our continuing mission to provide you with exceptional heart care, we have created designated Provider Care Teams.  These Care Teams include your primary Cardiologist (physician) and Advanced Practice Providers (APPs -  Physician Assistants and Nurse Practitioners) who all work together to provide you with the care you need, when you need it.  We recommend signing up for the patient portal called "MyChart".  Sign up information is provided on this After Visit Summary.  MyChart is used to connect with patients for Virtual Visits (Telemedicine).  Patients are able to view lab/test results, encounter notes, upcoming appointments, etc.  Non-urgent messages can be sent to your provider as well.   To learn more about what you can do with MyChart, go to [email or web address].    Your next appointment:   1 year(s)  Provider:   Allegra Lai, MD

## 2022-03-17 DIAGNOSIS — J309 Allergic rhinitis, unspecified: Secondary | ICD-10-CM | POA: Diagnosis not present

## 2022-03-17 DIAGNOSIS — D518 Other vitamin B12 deficiency anemias: Secondary | ICD-10-CM | POA: Diagnosis not present

## 2022-03-17 DIAGNOSIS — K219 Gastro-esophageal reflux disease without esophagitis: Secondary | ICD-10-CM | POA: Diagnosis not present

## 2022-03-17 DIAGNOSIS — E1165 Type 2 diabetes mellitus with hyperglycemia: Secondary | ICD-10-CM | POA: Diagnosis not present

## 2022-03-17 DIAGNOSIS — I10 Essential (primary) hypertension: Secondary | ICD-10-CM | POA: Diagnosis not present

## 2022-03-17 DIAGNOSIS — E559 Vitamin D deficiency, unspecified: Secondary | ICD-10-CM | POA: Diagnosis not present

## 2022-03-17 LAB — CBC
Hematocrit: 46.4 % (ref 37.5–51.0)
Hemoglobin: 15.4 g/dL (ref 13.0–17.7)
MCH: 29.6 pg (ref 26.6–33.0)
MCHC: 33.2 g/dL (ref 31.5–35.7)
MCV: 89 fL (ref 79–97)
Platelets: 288 10*3/uL (ref 150–450)
RBC: 5.2 x10E6/uL (ref 4.14–5.80)
RDW: 12.1 % (ref 11.6–15.4)
WBC: 7.1 10*3/uL (ref 3.4–10.8)

## 2022-03-17 LAB — BASIC METABOLIC PANEL
BUN/Creatinine Ratio: 10 (ref 10–24)
BUN: 9 mg/dL (ref 8–27)
CO2: 22 mmol/L (ref 20–29)
Calcium: 9.1 mg/dL (ref 8.6–10.2)
Chloride: 100 mmol/L (ref 96–106)
Creatinine, Ser: 0.94 mg/dL (ref 0.76–1.27)
Glucose: 230 mg/dL — ABNORMAL HIGH (ref 70–99)
Potassium: 4.7 mmol/L (ref 3.5–5.2)
Sodium: 136 mmol/L (ref 134–144)
eGFR: 86 mL/min/{1.73_m2} (ref >59)

## 2022-03-17 NOTE — Progress Notes (Signed)
Remote pacemaker transmission.   

## 2022-03-20 ENCOUNTER — Telehealth: Payer: Self-pay

## 2022-03-20 NOTE — Telephone Encounter (Signed)
Returned call to Pt.  Advised remote transmission was WNL.  He states he had a brief pain at the pacemaker site that lasted a few minutes and has resolved.  He mentions he did bring in an armload of wood this morning.  Advised pain could have been muscular in nature.  Pt relieved everything is working well.  No further needs.

## 2022-03-20 NOTE — Telephone Encounter (Signed)
Patient called in stating he has been having pain where the ppm is and then he felt weak this morning. Patient is sending a transmission and wants to know if he needs to come in.

## 2022-04-04 DIAGNOSIS — I10 Essential (primary) hypertension: Secondary | ICD-10-CM | POA: Diagnosis not present

## 2022-04-04 DIAGNOSIS — E119 Type 2 diabetes mellitus without complications: Secondary | ICD-10-CM | POA: Diagnosis not present

## 2022-04-04 DIAGNOSIS — D518 Other vitamin B12 deficiency anemias: Secondary | ICD-10-CM | POA: Diagnosis not present

## 2022-04-04 DIAGNOSIS — I2510 Atherosclerotic heart disease of native coronary artery without angina pectoris: Secondary | ICD-10-CM | POA: Diagnosis not present

## 2022-04-04 DIAGNOSIS — E038 Other specified hypothyroidism: Secondary | ICD-10-CM | POA: Diagnosis not present

## 2022-04-04 DIAGNOSIS — E782 Mixed hyperlipidemia: Secondary | ICD-10-CM | POA: Diagnosis not present

## 2022-04-04 DIAGNOSIS — E1165 Type 2 diabetes mellitus with hyperglycemia: Secondary | ICD-10-CM | POA: Diagnosis not present

## 2022-04-04 DIAGNOSIS — E785 Hyperlipidemia, unspecified: Secondary | ICD-10-CM | POA: Diagnosis not present

## 2022-04-04 DIAGNOSIS — E559 Vitamin D deficiency, unspecified: Secondary | ICD-10-CM | POA: Diagnosis not present

## 2022-04-04 DIAGNOSIS — D511 Vitamin B12 deficiency anemia due to selective vitamin B12 malabsorption with proteinuria: Secondary | ICD-10-CM | POA: Diagnosis not present

## 2022-04-06 ENCOUNTER — Other Ambulatory Visit: Payer: Self-pay | Admitting: Cardiothoracic Surgery

## 2022-04-06 DIAGNOSIS — I7121 Aneurysm of the ascending aorta, without rupture: Secondary | ICD-10-CM

## 2022-04-21 DIAGNOSIS — D518 Other vitamin B12 deficiency anemias: Secondary | ICD-10-CM | POA: Diagnosis not present

## 2022-04-21 DIAGNOSIS — E785 Hyperlipidemia, unspecified: Secondary | ICD-10-CM | POA: Diagnosis not present

## 2022-04-21 DIAGNOSIS — E119 Type 2 diabetes mellitus without complications: Secondary | ICD-10-CM | POA: Diagnosis not present

## 2022-04-21 DIAGNOSIS — E559 Vitamin D deficiency, unspecified: Secondary | ICD-10-CM | POA: Diagnosis not present

## 2022-04-21 DIAGNOSIS — E1165 Type 2 diabetes mellitus with hyperglycemia: Secondary | ICD-10-CM | POA: Diagnosis not present

## 2022-04-21 DIAGNOSIS — E038 Other specified hypothyroidism: Secondary | ICD-10-CM | POA: Diagnosis not present

## 2022-04-21 DIAGNOSIS — E782 Mixed hyperlipidemia: Secondary | ICD-10-CM | POA: Diagnosis not present

## 2022-04-21 DIAGNOSIS — I10 Essential (primary) hypertension: Secondary | ICD-10-CM | POA: Diagnosis not present

## 2022-04-21 DIAGNOSIS — I2510 Atherosclerotic heart disease of native coronary artery without angina pectoris: Secondary | ICD-10-CM | POA: Diagnosis not present

## 2022-04-21 DIAGNOSIS — D511 Vitamin B12 deficiency anemia due to selective vitamin B12 malabsorption with proteinuria: Secondary | ICD-10-CM | POA: Diagnosis not present

## 2022-05-01 DIAGNOSIS — H25813 Combined forms of age-related cataract, bilateral: Secondary | ICD-10-CM | POA: Diagnosis not present

## 2022-05-01 DIAGNOSIS — E113492 Type 2 diabetes mellitus with severe nonproliferative diabetic retinopathy without macular edema, left eye: Secondary | ICD-10-CM | POA: Diagnosis not present

## 2022-05-01 DIAGNOSIS — H3561 Retinal hemorrhage, right eye: Secondary | ICD-10-CM | POA: Diagnosis not present

## 2022-05-01 DIAGNOSIS — H4311 Vitreous hemorrhage, right eye: Secondary | ICD-10-CM | POA: Diagnosis not present

## 2022-05-01 DIAGNOSIS — H35372 Puckering of macula, left eye: Secondary | ICD-10-CM | POA: Diagnosis not present

## 2022-05-01 DIAGNOSIS — E113591 Type 2 diabetes mellitus with proliferative diabetic retinopathy without macular edema, right eye: Secondary | ICD-10-CM | POA: Diagnosis not present

## 2022-05-01 DIAGNOSIS — H35033 Hypertensive retinopathy, bilateral: Secondary | ICD-10-CM | POA: Diagnosis not present

## 2022-05-01 DIAGNOSIS — H43812 Vitreous degeneration, left eye: Secondary | ICD-10-CM | POA: Diagnosis not present

## 2022-05-22 ENCOUNTER — Ambulatory Visit: Payer: PPO

## 2022-05-22 ENCOUNTER — Ambulatory Visit: Admission: RE | Admit: 2022-05-22 | Payer: PPO | Source: Ambulatory Visit

## 2022-05-22 DIAGNOSIS — E1165 Type 2 diabetes mellitus with hyperglycemia: Secondary | ICD-10-CM | POA: Diagnosis not present

## 2022-05-22 DIAGNOSIS — E038 Other specified hypothyroidism: Secondary | ICD-10-CM | POA: Diagnosis not present

## 2022-05-22 DIAGNOSIS — I10 Essential (primary) hypertension: Secondary | ICD-10-CM | POA: Diagnosis not present

## 2022-05-22 DIAGNOSIS — I70223 Atherosclerosis of native arteries of extremities with rest pain, bilateral legs: Secondary | ICD-10-CM | POA: Diagnosis not present

## 2022-05-22 DIAGNOSIS — E559 Vitamin D deficiency, unspecified: Secondary | ICD-10-CM | POA: Diagnosis not present

## 2022-05-22 DIAGNOSIS — E782 Mixed hyperlipidemia: Secondary | ICD-10-CM | POA: Diagnosis not present

## 2022-05-22 DIAGNOSIS — E119 Type 2 diabetes mellitus without complications: Secondary | ICD-10-CM | POA: Diagnosis not present

## 2022-05-22 DIAGNOSIS — D511 Vitamin B12 deficiency anemia due to selective vitamin B12 malabsorption with proteinuria: Secondary | ICD-10-CM | POA: Diagnosis not present

## 2022-05-22 DIAGNOSIS — I2510 Atherosclerotic heart disease of native coronary artery without angina pectoris: Secondary | ICD-10-CM | POA: Diagnosis not present

## 2022-05-22 DIAGNOSIS — D518 Other vitamin B12 deficiency anemias: Secondary | ICD-10-CM | POA: Diagnosis not present

## 2022-05-22 DIAGNOSIS — E785 Hyperlipidemia, unspecified: Secondary | ICD-10-CM | POA: Diagnosis not present

## 2022-05-22 DIAGNOSIS — E7849 Other hyperlipidemia: Secondary | ICD-10-CM | POA: Diagnosis not present

## 2022-05-23 DIAGNOSIS — H25043 Posterior subcapsular polar age-related cataract, bilateral: Secondary | ICD-10-CM | POA: Diagnosis not present

## 2022-05-23 DIAGNOSIS — H2513 Age-related nuclear cataract, bilateral: Secondary | ICD-10-CM | POA: Diagnosis not present

## 2022-05-23 DIAGNOSIS — E113393 Type 2 diabetes mellitus with moderate nonproliferative diabetic retinopathy without macular edema, bilateral: Secondary | ICD-10-CM | POA: Diagnosis not present

## 2022-05-23 DIAGNOSIS — H25013 Cortical age-related cataract, bilateral: Secondary | ICD-10-CM | POA: Diagnosis not present

## 2022-05-23 DIAGNOSIS — H35372 Puckering of macula, left eye: Secondary | ICD-10-CM | POA: Diagnosis not present

## 2022-05-23 DIAGNOSIS — H2511 Age-related nuclear cataract, right eye: Secondary | ICD-10-CM | POA: Diagnosis not present

## 2022-05-23 DIAGNOSIS — H18413 Arcus senilis, bilateral: Secondary | ICD-10-CM | POA: Diagnosis not present

## 2022-05-23 DIAGNOSIS — H4311 Vitreous hemorrhage, right eye: Secondary | ICD-10-CM | POA: Diagnosis not present

## 2022-05-24 ENCOUNTER — Ambulatory Visit (INDEPENDENT_AMBULATORY_CARE_PROVIDER_SITE_OTHER): Payer: PPO

## 2022-05-24 DIAGNOSIS — I441 Atrioventricular block, second degree: Secondary | ICD-10-CM | POA: Diagnosis not present

## 2022-05-25 LAB — CUP PACEART REMOTE DEVICE CHECK
Battery Remaining Longevity: 80 mo
Battery Remaining Percentage: 65 %
Battery Voltage: 2.99 V
Brady Statistic AP VP Percent: 92 %
Brady Statistic AP VS Percent: 1 %
Brady Statistic AS VP Percent: 8.1 %
Brady Statistic AS VS Percent: 1 %
Brady Statistic RA Percent Paced: 92 %
Brady Statistic RV Percent Paced: 99 %
Date Time Interrogation Session: 20240320115108
Implantable Lead Connection Status: 753985
Implantable Lead Connection Status: 753985
Implantable Lead Implant Date: 20200731
Implantable Lead Implant Date: 20200731
Implantable Lead Location: 753859
Implantable Lead Location: 753860
Implantable Pulse Generator Implant Date: 20200731
Lead Channel Impedance Value: 390 Ohm
Lead Channel Impedance Value: 700 Ohm
Lead Channel Pacing Threshold Amplitude: 0.5 V
Lead Channel Pacing Threshold Amplitude: 0.5 V
Lead Channel Pacing Threshold Pulse Width: 0.4 ms
Lead Channel Pacing Threshold Pulse Width: 0.5 ms
Lead Channel Sensing Intrinsic Amplitude: 12 mV
Lead Channel Sensing Intrinsic Amplitude: 3.1 mV
Lead Channel Setting Pacing Amplitude: 0.75 V
Lead Channel Setting Pacing Amplitude: 1.5 V
Lead Channel Setting Pacing Pulse Width: 0.5 ms
Lead Channel Setting Sensing Sensitivity: 2 mV
Pulse Gen Model: 2272
Pulse Gen Serial Number: 9152674

## 2022-05-31 ENCOUNTER — Ambulatory Visit
Admission: RE | Admit: 2022-05-31 | Discharge: 2022-05-31 | Disposition: A | Discharge status: Home / Self Care | Payer: PPO | Source: Ambulatory Visit | Attending: Cardiothoracic Surgery | Admitting: Cardiothoracic Surgery

## 2022-05-31 DIAGNOSIS — I7121 Aneurysm of the ascending aorta, without rupture: Secondary | ICD-10-CM

## 2022-05-31 MED ORDER — IOPAMIDOL (ISOVUE-370) INJECTION 76%
75.0000 mL | Freq: Once | INTRAVENOUS | Status: AC | PRN
  Administered 2022-05-31: 75 mL via INTRAVENOUS

## 2022-06-01 NOTE — Progress Notes (Signed)
[address]       [place] [address]             [id number]     Jay Hanson KH:[id number] September 18, 1950  History of Present Illness:  Jay Hanson is a 72 yo male with known history of Bradycardia with 2nd degree AV Block S/P PPM, DM, CAD, and known ascending aortic aneurysm.  He was last seen in our office about 1 year ago at which time his Aneurysm was stable measuring 4.1 cm.  His blood pressure was elevated at that time and he was encouraged to get a home blood pressure monitor.  He presents today for 1 year follow up.  Currently the patient is doing okay.  He denies chest pain and shortness of breath.  He remains active and states his diabetes is under good control.  He is supposed to have cataract surgery next week.  Finally he states he is very stressed out as his wife has been given 1 week to live with a diagnosis of advanced cancer.  He would like to get back home to her and hated having to come and leave her today.  Current Outpatient Medications on File Prior to Visit  Medication Sig Dispense Refill   aspirin EC 81 MG tablet Take 81 mg by mouth daily.     carvedilol (COREG) 6.25 MG tablet Take 3.125 mg by mouth in the morning, at noon, in the evening, and at bedtime.     Cholecalciferol (VITAMIN D3) 125 MCG (5000 UT) TABS Take 5,000 Units by mouth daily.     clopidogrel (PLAVIX) 75 MG tablet Take 75 mg by mouth daily.     glucose blood test strip USE TO CHECK GLUCOSE 4 TIMES DAILY     insulin aspart protamine- aspart (NOVOLOG MIX 70/30) (70-30) 100 UNIT/ML injection Inject 0-100 Units into the skin as needed (for blood sugar).     insulin detemir (LEVEMIR) 100 UNIT/ML injection Inject 60 Units into the skin 2 (two) times daily.      levocetirizine (XYZAL) 5 MG tablet Take 5 mg by mouth every evening.     pantoprazole (PROTONIX) 40 MG tablet Take 40 mg by mouth daily.     sitaGLIPtin (JANUVIA) 100 MG tablet Take 100 mg by mouth daily.     cetirizine (ZYRTEC)  10 MG tablet Take 10 mg by mouth daily.      No current facility-administered medications on file prior to visit.     BP (!) 153/90   Pulse 80   Resp 20   Ht 5\' 8"  (1.727 m)   Wt 250 lb (113.4 kg)   SpO2 98% Comment: RA  BMI 38.01 kg/m   Physical Exam  BP (!) 153/90   Pulse 80   Resp 20   Ht 5\' 8"  (1.727 m)   Wt 250 lb (113.4 kg)   SpO2 98% Comment: RA  BMI 38.01 kg/m   Gen: NAD Heart: RRR Lungs: CTA bilaterally Neck: No Carotid Bruit Ext: no edema Neuro: grossly intact  CTA Results:  EXAM: CT ANGIOGRAPHY CHEST WITH CONTRAST   TECHNIQUE: Multidetector CT imaging of the chest was performed using the standard protocol during bolus administration of intravenous contrast. Multiplanar CT image reconstructions and MIPs were obtained to evaluate the vascular anatomy.   RADIATION DOSE REDUCTION: This exam was performed according to the departmental dose-optimization program which includes automated exposure control, adjustment of the mA and/or kV according to patient size and/or  use of iterative reconstruction technique.   CONTRAST:  18mL ISOVUE-370 IOPAMIDOL (ISOVUE-370) INJECTION 76%   COMPARISON:  CT chest 04/15/2021   FINDINGS: Cardiovascular: Cardiac device leads are seen terminating in the right atrium and right ventricle. The heart is not enlarged. The ascending thoracic aorta is unchanged in caliber measuring up to 4.0 cm in the axial plane (4-61). There is no evidence of dissection. There is minimal calcified plaque in the descending thoracic aorta. The pulmonary vasculature is unremarkable. Mild coronary artery calcifications are again noted.   Mediastinum/Nodes: The imaged thyroid is unremarkable. The esophagus is grossly unremarkable. There is no mediastinal, hilar, or axillary lymphadenopathy.   Lungs/Pleura: The trachea and central airways are patent.   The lungs are well inflated. There is no focal consolidation or pulmonary edema. There is  no pleural effusion or pneumothorax. Minimal linear scarring in the anteromedial right upper lobe is unchanged. There are no suspicious nodules.   Upper Abdomen: Marked fatty atrophy of the pancreas is again seen. A small left upper pole renal cyst is again seen requiring no specific imaging follow-up. The imaged portions of the upper abdominal viscera are otherwise unremarkable.   Musculoskeletal: There is no acute osseous abnormality or suspicious osseous lesion.   Review of the MIP images confirms the above findings.   Creatinine was obtained on site at [facility] at [address]   Results: Creatinine 1.2 mg/dL.   IMPRESSION: Unchanged mild aneurysmal dilation of the ascending thoracic aorta measuring up to 4.0 cm continued annual follow-up is recommended.     Electronically Signed   By: Valetta Mole M.D.   On: 05/31/2022 09:57    A/P:  Stable Ascending Aortic Aneurysm- measuring 4.0.Marland Kitchen will continue annual surveillance with repeat CTA of the chest in 1 year HTN- elevated today, not unexpected with current life stressors, he is monitored closely by his family doctor and normally this is very well controlled DM Bradycardia with AV Block S/P PPM- per EP   Repeat CTA chest in 1 year time.. patient would like follow up appointment to be virtual  Risk Modification:  Statin:  No patient instructed to discuss with Primary care physician about initiation  Smoking cessation instruction/counseling given:  doesn't smoke  Patient was counseled on importance of Blood Pressure Control.  Despite Medical intervention if the patient notices persistently elevated blood pressure readings.  They are instructed to contact their Primary Care Physician  Please avoid use of Fluoroquinolones as this can potentially increase your risk of Aortic Rupture and/or Dissection  Patient educated on signs and symptoms of Aortic Dissection, handout also provided in AVS  [name], PA-C 06/05/22

## 2022-06-05 ENCOUNTER — Ambulatory Visit (INDEPENDENT_AMBULATORY_CARE_PROVIDER_SITE_OTHER): Payer: PPO | Admitting: Physician Assistant

## 2022-06-05 VITALS — BP 153/90 | HR 80 | Resp 20 | Ht 68.0 in | Wt 250.0 lb

## 2022-06-05 DIAGNOSIS — I7121 Aneurysm of the ascending aorta, without rupture: Secondary | ICD-10-CM | POA: Diagnosis not present

## 2022-06-07 DIAGNOSIS — E782 Mixed hyperlipidemia: Secondary | ICD-10-CM | POA: Diagnosis not present

## 2022-06-07 DIAGNOSIS — I70223 Atherosclerosis of native arteries of extremities with rest pain, bilateral legs: Secondary | ICD-10-CM | POA: Diagnosis not present

## 2022-06-07 DIAGNOSIS — I10 Essential (primary) hypertension: Secondary | ICD-10-CM | POA: Diagnosis not present

## 2022-06-07 DIAGNOSIS — E1165 Type 2 diabetes mellitus with hyperglycemia: Secondary | ICD-10-CM | POA: Diagnosis not present

## 2022-06-07 DIAGNOSIS — E559 Vitamin D deficiency, unspecified: Secondary | ICD-10-CM | POA: Diagnosis not present

## 2022-06-07 DIAGNOSIS — E785 Hyperlipidemia, unspecified: Secondary | ICD-10-CM | POA: Diagnosis not present

## 2022-06-07 DIAGNOSIS — E038 Other specified hypothyroidism: Secondary | ICD-10-CM | POA: Diagnosis not present

## 2022-06-07 DIAGNOSIS — E119 Type 2 diabetes mellitus without complications: Secondary | ICD-10-CM | POA: Diagnosis not present

## 2022-06-07 DIAGNOSIS — I2510 Atherosclerotic heart disease of native coronary artery without angina pectoris: Secondary | ICD-10-CM | POA: Diagnosis not present

## 2022-06-07 DIAGNOSIS — D511 Vitamin B12 deficiency anemia due to selective vitamin B12 malabsorption with proteinuria: Secondary | ICD-10-CM | POA: Diagnosis not present

## 2022-06-07 DIAGNOSIS — D518 Other vitamin B12 deficiency anemias: Secondary | ICD-10-CM | POA: Diagnosis not present

## 2022-06-07 DIAGNOSIS — E7849 Other hyperlipidemia: Secondary | ICD-10-CM | POA: Diagnosis not present

## 2022-06-28 DIAGNOSIS — E113511 Type 2 diabetes mellitus with proliferative diabetic retinopathy with macular edema, right eye: Secondary | ICD-10-CM | POA: Diagnosis not present

## 2022-06-28 DIAGNOSIS — H4311 Vitreous hemorrhage, right eye: Secondary | ICD-10-CM | POA: Diagnosis not present

## 2022-06-28 DIAGNOSIS — H2511 Age-related nuclear cataract, right eye: Secondary | ICD-10-CM | POA: Diagnosis not present

## 2022-06-28 DIAGNOSIS — H35371 Puckering of macula, right eye: Secondary | ICD-10-CM | POA: Diagnosis not present

## 2022-06-29 NOTE — Progress Notes (Signed)
Remote pacemaker transmission.   

## 2022-07-06 DIAGNOSIS — Z9889 Other specified postprocedural states: Secondary | ICD-10-CM | POA: Diagnosis not present

## 2022-07-06 DIAGNOSIS — E113591 Type 2 diabetes mellitus with proliferative diabetic retinopathy without macular edema, right eye: Secondary | ICD-10-CM | POA: Diagnosis not present

## 2022-07-06 DIAGNOSIS — H35371 Puckering of macula, right eye: Secondary | ICD-10-CM | POA: Diagnosis not present

## 2022-07-06 DIAGNOSIS — H4311 Vitreous hemorrhage, right eye: Secondary | ICD-10-CM | POA: Diagnosis not present

## 2022-07-14 DIAGNOSIS — I2510 Atherosclerotic heart disease of native coronary artery without angina pectoris: Secondary | ICD-10-CM | POA: Diagnosis not present

## 2022-07-14 DIAGNOSIS — I10 Essential (primary) hypertension: Secondary | ICD-10-CM | POA: Diagnosis not present

## 2022-07-14 DIAGNOSIS — Z0000 Encounter for general adult medical examination without abnormal findings: Secondary | ICD-10-CM | POA: Diagnosis not present

## 2022-07-14 DIAGNOSIS — J309 Allergic rhinitis, unspecified: Secondary | ICD-10-CM | POA: Diagnosis not present

## 2022-07-14 DIAGNOSIS — Z125 Encounter for screening for malignant neoplasm of prostate: Secondary | ICD-10-CM | POA: Diagnosis not present

## 2022-07-14 DIAGNOSIS — E782 Mixed hyperlipidemia: Secondary | ICD-10-CM | POA: Diagnosis not present

## 2022-07-14 DIAGNOSIS — E559 Vitamin D deficiency, unspecified: Secondary | ICD-10-CM | POA: Diagnosis not present

## 2022-07-14 DIAGNOSIS — D518 Other vitamin B12 deficiency anemias: Secondary | ICD-10-CM | POA: Diagnosis not present

## 2022-07-14 DIAGNOSIS — E119 Type 2 diabetes mellitus without complications: Secondary | ICD-10-CM | POA: Diagnosis not present

## 2022-07-14 DIAGNOSIS — E038 Other specified hypothyroidism: Secondary | ICD-10-CM | POA: Diagnosis not present

## 2022-07-14 DIAGNOSIS — K219 Gastro-esophageal reflux disease without esophagitis: Secondary | ICD-10-CM | POA: Diagnosis not present

## 2022-07-14 DIAGNOSIS — E1165 Type 2 diabetes mellitus with hyperglycemia: Secondary | ICD-10-CM | POA: Diagnosis not present

## 2022-07-14 DIAGNOSIS — N400 Enlarged prostate without lower urinary tract symptoms: Secondary | ICD-10-CM | POA: Diagnosis not present

## 2022-07-27 DIAGNOSIS — H35371 Puckering of macula, right eye: Secondary | ICD-10-CM | POA: Diagnosis not present

## 2022-07-27 DIAGNOSIS — H4311 Vitreous hemorrhage, right eye: Secondary | ICD-10-CM | POA: Diagnosis not present

## 2022-07-27 DIAGNOSIS — Z9889 Other specified postprocedural states: Secondary | ICD-10-CM | POA: Diagnosis not present

## 2022-07-27 DIAGNOSIS — E113591 Type 2 diabetes mellitus with proliferative diabetic retinopathy without macular edema, right eye: Secondary | ICD-10-CM | POA: Diagnosis not present

## 2022-07-27 DIAGNOSIS — H2512 Age-related nuclear cataract, left eye: Secondary | ICD-10-CM | POA: Diagnosis not present

## 2022-08-01 DIAGNOSIS — E038 Other specified hypothyroidism: Secondary | ICD-10-CM | POA: Diagnosis not present

## 2022-08-01 DIAGNOSIS — I10 Essential (primary) hypertension: Secondary | ICD-10-CM | POA: Diagnosis not present

## 2022-08-01 DIAGNOSIS — E782 Mixed hyperlipidemia: Secondary | ICD-10-CM | POA: Diagnosis not present

## 2022-08-01 DIAGNOSIS — E119 Type 2 diabetes mellitus without complications: Secondary | ICD-10-CM | POA: Diagnosis not present

## 2022-08-01 DIAGNOSIS — E7849 Other hyperlipidemia: Secondary | ICD-10-CM | POA: Diagnosis not present

## 2022-08-01 DIAGNOSIS — I2510 Atherosclerotic heart disease of native coronary artery without angina pectoris: Secondary | ICD-10-CM | POA: Diagnosis not present

## 2022-08-01 DIAGNOSIS — D511 Vitamin B12 deficiency anemia due to selective vitamin B12 malabsorption with proteinuria: Secondary | ICD-10-CM | POA: Diagnosis not present

## 2022-08-01 DIAGNOSIS — E785 Hyperlipidemia, unspecified: Secondary | ICD-10-CM | POA: Diagnosis not present

## 2022-08-01 DIAGNOSIS — D518 Other vitamin B12 deficiency anemias: Secondary | ICD-10-CM | POA: Diagnosis not present

## 2022-08-01 DIAGNOSIS — E559 Vitamin D deficiency, unspecified: Secondary | ICD-10-CM | POA: Diagnosis not present

## 2022-08-01 DIAGNOSIS — E1165 Type 2 diabetes mellitus with hyperglycemia: Secondary | ICD-10-CM | POA: Diagnosis not present

## 2022-08-01 DIAGNOSIS — I70223 Atherosclerosis of native arteries of extremities with rest pain, bilateral legs: Secondary | ICD-10-CM | POA: Diagnosis not present

## 2022-08-07 DIAGNOSIS — H35371 Puckering of macula, right eye: Secondary | ICD-10-CM | POA: Diagnosis not present

## 2022-08-07 DIAGNOSIS — E113591 Type 2 diabetes mellitus with proliferative diabetic retinopathy without macular edema, right eye: Secondary | ICD-10-CM | POA: Diagnosis not present

## 2022-08-07 DIAGNOSIS — H4311 Vitreous hemorrhage, right eye: Secondary | ICD-10-CM | POA: Diagnosis not present

## 2022-08-07 DIAGNOSIS — Z9889 Other specified postprocedural states: Secondary | ICD-10-CM | POA: Diagnosis not present

## 2022-08-15 DIAGNOSIS — E7849 Other hyperlipidemia: Secondary | ICD-10-CM | POA: Diagnosis not present

## 2022-08-15 DIAGNOSIS — I10 Essential (primary) hypertension: Secondary | ICD-10-CM | POA: Diagnosis not present

## 2022-08-15 DIAGNOSIS — D518 Other vitamin B12 deficiency anemias: Secondary | ICD-10-CM | POA: Diagnosis not present

## 2022-08-15 DIAGNOSIS — E559 Vitamin D deficiency, unspecified: Secondary | ICD-10-CM | POA: Diagnosis not present

## 2022-08-15 DIAGNOSIS — E038 Other specified hypothyroidism: Secondary | ICD-10-CM | POA: Diagnosis not present

## 2022-08-15 DIAGNOSIS — E1165 Type 2 diabetes mellitus with hyperglycemia: Secondary | ICD-10-CM | POA: Diagnosis not present

## 2022-08-15 DIAGNOSIS — D511 Vitamin B12 deficiency anemia due to selective vitamin B12 malabsorption with proteinuria: Secondary | ICD-10-CM | POA: Diagnosis not present

## 2022-08-15 DIAGNOSIS — E785 Hyperlipidemia, unspecified: Secondary | ICD-10-CM | POA: Diagnosis not present

## 2022-08-15 DIAGNOSIS — I2510 Atherosclerotic heart disease of native coronary artery without angina pectoris: Secondary | ICD-10-CM | POA: Diagnosis not present

## 2022-08-15 DIAGNOSIS — E782 Mixed hyperlipidemia: Secondary | ICD-10-CM | POA: Diagnosis not present

## 2022-08-15 DIAGNOSIS — I70223 Atherosclerosis of native arteries of extremities with rest pain, bilateral legs: Secondary | ICD-10-CM | POA: Diagnosis not present

## 2022-08-15 DIAGNOSIS — E119 Type 2 diabetes mellitus without complications: Secondary | ICD-10-CM | POA: Diagnosis not present

## 2022-08-23 ENCOUNTER — Ambulatory Visit (INDEPENDENT_AMBULATORY_CARE_PROVIDER_SITE_OTHER): Payer: PPO

## 2022-08-23 DIAGNOSIS — I441 Atrioventricular block, second degree: Secondary | ICD-10-CM | POA: Diagnosis not present

## 2022-08-23 LAB — CUP PACEART REMOTE DEVICE CHECK
Battery Remaining Longevity: 76 mo
Battery Remaining Percentage: 62 %
Battery Voltage: 2.99 V
Brady Statistic AP VP Percent: 92 %
Brady Statistic AP VS Percent: 1 %
Brady Statistic AS VP Percent: 8.2 %
Brady Statistic AS VS Percent: 1 %
Brady Statistic RA Percent Paced: 92 %
Brady Statistic RV Percent Paced: 99 %
Date Time Interrogation Session: 20240619082926
Implantable Lead Connection Status: 753985
Implantable Lead Connection Status: 753985
Implantable Lead Implant Date: 20200731
Implantable Lead Implant Date: 20200731
Implantable Lead Location: 753859
Implantable Lead Location: 753860
Implantable Pulse Generator Implant Date: 20200731
Lead Channel Impedance Value: 390 Ohm
Lead Channel Impedance Value: 710 Ohm
Lead Channel Pacing Threshold Amplitude: 0.5 V
Lead Channel Pacing Threshold Amplitude: 0.5 V
Lead Channel Pacing Threshold Pulse Width: 0.4 ms
Lead Channel Pacing Threshold Pulse Width: 0.5 ms
Lead Channel Sensing Intrinsic Amplitude: 10.3 mV
Lead Channel Sensing Intrinsic Amplitude: 3.6 mV
Lead Channel Setting Pacing Amplitude: 0.75 V
Lead Channel Setting Pacing Amplitude: 1.5 V
Lead Channel Setting Pacing Pulse Width: 0.5 ms
Lead Channel Setting Sensing Sensitivity: 2 mV
Pulse Gen Model: 2272
Pulse Gen Serial Number: 9152674

## 2022-10-02 DIAGNOSIS — H25813 Combined forms of age-related cataract, bilateral: Secondary | ICD-10-CM | POA: Diagnosis not present

## 2022-10-02 DIAGNOSIS — H35033 Hypertensive retinopathy, bilateral: Secondary | ICD-10-CM | POA: Diagnosis not present

## 2022-10-02 DIAGNOSIS — H35372 Puckering of macula, left eye: Secondary | ICD-10-CM | POA: Diagnosis not present

## 2022-10-02 DIAGNOSIS — E113593 Type 2 diabetes mellitus with proliferative diabetic retinopathy without macular edema, bilateral: Secondary | ICD-10-CM | POA: Diagnosis not present

## 2022-10-02 DIAGNOSIS — H4311 Vitreous hemorrhage, right eye: Secondary | ICD-10-CM | POA: Diagnosis not present

## 2022-10-02 DIAGNOSIS — H43812 Vitreous degeneration, left eye: Secondary | ICD-10-CM | POA: Diagnosis not present

## 2022-10-04 DIAGNOSIS — I70223 Atherosclerosis of native arteries of extremities with rest pain, bilateral legs: Secondary | ICD-10-CM | POA: Diagnosis not present

## 2022-10-04 DIAGNOSIS — I2510 Atherosclerotic heart disease of native coronary artery without angina pectoris: Secondary | ICD-10-CM | POA: Diagnosis not present

## 2022-10-04 DIAGNOSIS — E7849 Other hyperlipidemia: Secondary | ICD-10-CM | POA: Diagnosis not present

## 2022-10-04 DIAGNOSIS — E038 Other specified hypothyroidism: Secondary | ICD-10-CM | POA: Diagnosis not present

## 2022-10-04 DIAGNOSIS — E1165 Type 2 diabetes mellitus with hyperglycemia: Secondary | ICD-10-CM | POA: Diagnosis not present

## 2022-10-04 DIAGNOSIS — D511 Vitamin B12 deficiency anemia due to selective vitamin B12 malabsorption with proteinuria: Secondary | ICD-10-CM | POA: Diagnosis not present

## 2022-10-04 DIAGNOSIS — E559 Vitamin D deficiency, unspecified: Secondary | ICD-10-CM | POA: Diagnosis not present

## 2022-10-04 DIAGNOSIS — E119 Type 2 diabetes mellitus without complications: Secondary | ICD-10-CM | POA: Diagnosis not present

## 2022-10-04 DIAGNOSIS — I10 Essential (primary) hypertension: Secondary | ICD-10-CM | POA: Diagnosis not present

## 2022-10-04 DIAGNOSIS — E782 Mixed hyperlipidemia: Secondary | ICD-10-CM | POA: Diagnosis not present

## 2022-10-04 DIAGNOSIS — E785 Hyperlipidemia, unspecified: Secondary | ICD-10-CM | POA: Diagnosis not present

## 2022-10-04 DIAGNOSIS — D518 Other vitamin B12 deficiency anemias: Secondary | ICD-10-CM | POA: Diagnosis not present

## 2022-10-23 DIAGNOSIS — D518 Other vitamin B12 deficiency anemias: Secondary | ICD-10-CM | POA: Diagnosis not present

## 2022-10-23 DIAGNOSIS — J309 Allergic rhinitis, unspecified: Secondary | ICD-10-CM | POA: Diagnosis not present

## 2022-10-23 DIAGNOSIS — E1165 Type 2 diabetes mellitus with hyperglycemia: Secondary | ICD-10-CM | POA: Diagnosis not present

## 2022-10-23 DIAGNOSIS — Z23 Encounter for immunization: Secondary | ICD-10-CM | POA: Diagnosis not present

## 2022-10-23 DIAGNOSIS — I10 Essential (primary) hypertension: Secondary | ICD-10-CM | POA: Diagnosis not present

## 2022-10-23 DIAGNOSIS — I2510 Atherosclerotic heart disease of native coronary artery without angina pectoris: Secondary | ICD-10-CM | POA: Diagnosis not present

## 2022-10-23 DIAGNOSIS — E782 Mixed hyperlipidemia: Secondary | ICD-10-CM | POA: Diagnosis not present

## 2022-10-23 DIAGNOSIS — Z79899 Other long term (current) drug therapy: Secondary | ICD-10-CM | POA: Diagnosis not present

## 2022-10-23 DIAGNOSIS — E038 Other specified hypothyroidism: Secondary | ICD-10-CM | POA: Diagnosis not present

## 2022-10-23 DIAGNOSIS — E559 Vitamin D deficiency, unspecified: Secondary | ICD-10-CM | POA: Diagnosis not present

## 2022-10-23 DIAGNOSIS — K219 Gastro-esophageal reflux disease without esophagitis: Secondary | ICD-10-CM | POA: Diagnosis not present

## 2022-10-23 DIAGNOSIS — E119 Type 2 diabetes mellitus without complications: Secondary | ICD-10-CM | POA: Diagnosis not present

## 2022-10-30 DIAGNOSIS — H25812 Combined forms of age-related cataract, left eye: Secondary | ICD-10-CM | POA: Diagnosis not present

## 2022-10-30 DIAGNOSIS — H35033 Hypertensive retinopathy, bilateral: Secondary | ICD-10-CM | POA: Diagnosis not present

## 2022-10-30 DIAGNOSIS — H43812 Vitreous degeneration, left eye: Secondary | ICD-10-CM | POA: Diagnosis not present

## 2022-10-30 DIAGNOSIS — E113593 Type 2 diabetes mellitus with proliferative diabetic retinopathy without macular edema, bilateral: Secondary | ICD-10-CM | POA: Diagnosis not present

## 2022-10-30 DIAGNOSIS — H35372 Puckering of macula, left eye: Secondary | ICD-10-CM | POA: Diagnosis not present

## 2022-10-30 DIAGNOSIS — H4311 Vitreous hemorrhage, right eye: Secondary | ICD-10-CM | POA: Diagnosis not present

## 2022-11-02 DIAGNOSIS — E785 Hyperlipidemia, unspecified: Secondary | ICD-10-CM | POA: Diagnosis not present

## 2022-11-02 DIAGNOSIS — D518 Other vitamin B12 deficiency anemias: Secondary | ICD-10-CM | POA: Diagnosis not present

## 2022-11-02 DIAGNOSIS — I2510 Atherosclerotic heart disease of native coronary artery without angina pectoris: Secondary | ICD-10-CM | POA: Diagnosis not present

## 2022-11-02 DIAGNOSIS — E782 Mixed hyperlipidemia: Secondary | ICD-10-CM | POA: Diagnosis not present

## 2022-11-02 DIAGNOSIS — E559 Vitamin D deficiency, unspecified: Secondary | ICD-10-CM | POA: Diagnosis not present

## 2022-11-02 DIAGNOSIS — E119 Type 2 diabetes mellitus without complications: Secondary | ICD-10-CM | POA: Diagnosis not present

## 2022-11-02 DIAGNOSIS — E7849 Other hyperlipidemia: Secondary | ICD-10-CM | POA: Diagnosis not present

## 2022-11-02 DIAGNOSIS — E1165 Type 2 diabetes mellitus with hyperglycemia: Secondary | ICD-10-CM | POA: Diagnosis not present

## 2022-11-02 DIAGNOSIS — I10 Essential (primary) hypertension: Secondary | ICD-10-CM | POA: Diagnosis not present

## 2022-11-02 DIAGNOSIS — D511 Vitamin B12 deficiency anemia due to selective vitamin B12 malabsorption with proteinuria: Secondary | ICD-10-CM | POA: Diagnosis not present

## 2022-11-02 DIAGNOSIS — I70223 Atherosclerosis of native arteries of extremities with rest pain, bilateral legs: Secondary | ICD-10-CM | POA: Diagnosis not present

## 2022-11-02 DIAGNOSIS — E038 Other specified hypothyroidism: Secondary | ICD-10-CM | POA: Diagnosis not present

## 2022-11-08 DIAGNOSIS — H35371 Puckering of macula, right eye: Secondary | ICD-10-CM | POA: Diagnosis not present

## 2022-11-08 DIAGNOSIS — H4311 Vitreous hemorrhage, right eye: Secondary | ICD-10-CM | POA: Diagnosis not present

## 2022-11-08 DIAGNOSIS — E113511 Type 2 diabetes mellitus with proliferative diabetic retinopathy with macular edema, right eye: Secondary | ICD-10-CM | POA: Diagnosis not present

## 2022-11-16 DIAGNOSIS — E113591 Type 2 diabetes mellitus with proliferative diabetic retinopathy without macular edema, right eye: Secondary | ICD-10-CM | POA: Diagnosis not present

## 2022-11-16 DIAGNOSIS — H35371 Puckering of macula, right eye: Secondary | ICD-10-CM | POA: Diagnosis not present

## 2022-11-16 DIAGNOSIS — H4311 Vitreous hemorrhage, right eye: Secondary | ICD-10-CM | POA: Diagnosis not present

## 2022-11-16 DIAGNOSIS — H35372 Puckering of macula, left eye: Secondary | ICD-10-CM | POA: Diagnosis not present

## 2022-11-16 DIAGNOSIS — Z9889 Other specified postprocedural states: Secondary | ICD-10-CM | POA: Diagnosis not present

## 2022-11-20 ENCOUNTER — Emergency Department (HOSPITAL_COMMUNITY)
Admission: EM | Admit: 2022-11-20 | Discharge: 2022-11-20 | Disposition: A | Discharge status: Home / Self Care | Payer: PPO | Attending: Emergency Medicine | Admitting: Emergency Medicine

## 2022-11-20 ENCOUNTER — Emergency Department (HOSPITAL_COMMUNITY): Payer: PPO

## 2022-11-20 DIAGNOSIS — I10 Essential (primary) hypertension: Secondary | ICD-10-CM | POA: Diagnosis not present

## 2022-11-20 DIAGNOSIS — Z950 Presence of cardiac pacemaker: Secondary | ICD-10-CM | POA: Diagnosis not present

## 2022-11-20 DIAGNOSIS — Z7901 Long term (current) use of anticoagulants: Secondary | ICD-10-CM | POA: Insufficient documentation

## 2022-11-20 DIAGNOSIS — R000 Tachycardia, unspecified: Secondary | ICD-10-CM | POA: Diagnosis not present

## 2022-11-20 DIAGNOSIS — Z79899 Other long term (current) drug therapy: Secondary | ICD-10-CM | POA: Insufficient documentation

## 2022-11-20 DIAGNOSIS — Z7982 Long term (current) use of aspirin: Secondary | ICD-10-CM | POA: Diagnosis not present

## 2022-11-20 DIAGNOSIS — Z794 Long term (current) use of insulin: Secondary | ICD-10-CM | POA: Insufficient documentation

## 2022-11-20 DIAGNOSIS — Z7984 Long term (current) use of oral hypoglycemic drugs: Secondary | ICD-10-CM | POA: Diagnosis not present

## 2022-11-20 DIAGNOSIS — R791 Abnormal coagulation profile: Secondary | ICD-10-CM | POA: Insufficient documentation

## 2022-11-20 DIAGNOSIS — J929 Pleural plaque without asbestos: Secondary | ICD-10-CM | POA: Diagnosis not present

## 2022-11-20 DIAGNOSIS — R002 Palpitations: Secondary | ICD-10-CM | POA: Insufficient documentation

## 2022-11-20 DIAGNOSIS — I2510 Atherosclerotic heart disease of native coronary artery without angina pectoris: Secondary | ICD-10-CM | POA: Diagnosis not present

## 2022-11-20 LAB — BASIC METABOLIC PANEL
Anion gap: 10 (ref 5–15)
BUN: 10 mg/dL (ref 8–23)
CO2: 24 mmol/L (ref 22–32)
Calcium: 8.8 mg/dL — ABNORMAL LOW (ref 8.9–10.3)
Chloride: 103 mmol/L (ref 98–111)
Creatinine, Ser: 0.93 mg/dL (ref 0.61–1.24)
GFR, Estimated: >60 mL/min (ref >60)
Glucose, Bld: 145 mg/dL — ABNORMAL HIGH (ref 70–99)
Potassium: 4.6 mmol/L (ref 3.5–5.1)
Sodium: 137 mmol/L (ref 135–145)

## 2022-11-20 LAB — CBC
HCT: 44.9 % (ref 39.0–52.0)
Hemoglobin: 15 g/dL (ref 13.0–17.0)
MCH: 31.4 pg (ref 26.0–34.0)
MCHC: 33.4 g/dL (ref 30.0–36.0)
MCV: 93.9 fL (ref 80.0–100.0)
Platelets: 249 10*3/uL (ref 150–400)
RBC: 4.78 MIL/uL (ref 4.22–5.81)
RDW: 12.2 % (ref 11.5–15.5)
WBC: 6.9 10*3/uL (ref 4.0–10.5)
nRBC: 0 % (ref 0.0–0.2)

## 2022-11-20 LAB — TROPONIN I (HIGH SENSITIVITY): Troponin I (High Sensitivity): 5 ng/L (ref <18)

## 2022-11-20 NOTE — ED Provider Notes (Signed)
[place] EMERGENCY DEPARTMENT AT [facility] Provider Note   CSN: [id number] Arrival date & time: 11/20/22  1512     History  Chief Complaint  Patient presents with   Chest Pain    Jay Hanson is a 72 y.o. male.   Chest Pain Patient reports that earlier today he had gotten up from his recliner to walk across the living room when he suddenly had the feeling that his heart was racing. He did not have chest pain or shortness of breath with this. He does report that he always has a little trouble breathing but he relates this to his sinus congestion. He reports that his symptoms occurred around 11 AM and lasted for a few seconds prior to resolving. He has not had any symptoms since. In triage he noted difficulty with urination however he is not endorsing this on my evaluation.      Home Medications Prior to Admission medications   Medication Sig Start Date End Date Taking? Authorizing Provider  aspirin EC 81 MG tablet Take 81 mg by mouth daily.    [provider]  carvedilol (COREG) 6.25 MG tablet Take 3.125 mg by mouth in the morning, at noon, in the evening, and at bedtime.    [provider]  cetirizine (ZYRTEC) 10 MG tablet Take 10 mg by mouth daily.  06/10/18 11/22/20  [provider]  Cholecalciferol (VITAMIN D3) 125 MCG (5000 UT) TABS Take 5,000 Units by mouth daily.    [provider]  clopidogrel (PLAVIX) 75 MG tablet Take 75 mg by mouth daily.    [provider]  glucose blood test strip USE TO CHECK GLUCOSE 4 TIMES DAILY 06/11/18   [provider]  insulin aspart protamine- aspart (NOVOLOG MIX 70/30) (70-30) 100 UNIT/ML injection Inject 0-100 Units into the skin as needed (for blood sugar).    [provider]  insulin detemir (LEVEMIR) 100 UNIT/ML injection Inject 60 Units into the skin 2 (two) times daily.     [provider]  levocetirizine (XYZAL) 5 MG tablet Take 5 mg by mouth every evening.     [provider]  pantoprazole (PROTONIX) 40 MG tablet Take 40 mg by mouth daily. 08/20/18   [provider]  sitaGLIPtin (JANUVIA) 100 MG tablet Take 100 mg by mouth daily.    [provider]      Allergies    Azithromycin and Canagliflozin    Review of Systems   Review of Systems  Cardiovascular:  Positive for chest pain.    Physical Exam Updated Vital Signs BP 129/84   Pulse 60   Temp 98.4 F (36.9 C) (Oral)   Resp 17   SpO2 100%  Physical Exam  ED Results / Procedures / Treatments   Labs (all labs ordered are listed, but only abnormal results are displayed) Labs Reviewed  BASIC METABOLIC PANEL - Abnormal; Notable for the following components:      Result Value   Glucose, Bld 145 (*)    Calcium 8.8 (*)    All other components within normal limits  CBC  TROPONIN I (HIGH SENSITIVITY)  TROPONIN I (HIGH SENSITIVITY)    EKG None  Radiology DG Chest 2 View  Result Date: 11/20/2022 CLINICAL DATA:  Pacemaker. EXAM: CHEST - 2 VIEW COMPARISON:  X-ray 10/05/2018.  CT 05/31/2022 FINDINGS: Pacemaker again noted with the leads along the right side of the heart. Blunting of the left costophrenic angle. Pleural thickening or prominent fat as  seen on prior CT. No consolidation, pneumothorax or edema. Normal cardiopericardial silhouette. Degenerative changes of the spine. IMPRESSION: Pacemaker. Stable left-sided pleural thickening Electronically Signed   By: Karen Kays M.D.   On: 11/20/2022 17:48    Procedures Procedures    Medications Ordered in ED Medications - No data to display  ED Course/ Medical Decision Making/ A&P                                 Medical Decision Making Amount and/or Complexity of Data Reviewed Labs: ordered. Radiology: ordered.   Patient is an 72 year old male with a history of hypertension, CAD, hyperlipidemia, prior PE on warfarin presenting for palpitations.  On my initial evaluation, he is afebrile,  hemodynamically stable, in no acute distress.  He reports a brief episode earlier this morning in which he had the sensation that his heart was racing after he walked across his living room.  He is denying significant chest pain or shortness of breath and his symptoms have not recurred.  On exam, he has no pulse deficit, abnormal breath sounds, or evidence of increased work of breathing.   As patient has a pacemaker, will obtain interrogation to evaluate for potential arrhythmia associated with his earlier symptoms.  Additionally will obtain labs to evaluate for electrolyte abnormality, metabolic abnormality, ischemia.   Results reviewed.  CBC without leukocytosis, mild anemia present with hemoglobin 11.6 which is similar when compared to baseline.  INR elevated to 3.5.  UA noninfectious.  Chest x-ray demonstrates enlarged cardiac silhouette without associated acute abnormality.  Personally reviewed EKG and do not note any evidence of heart block, bundle branch block, ST elevation, ST depression.  Pacemaker was interrogated in the emergency department did not note any evidence of significant arrhythmia.  Results discussed with patient.  Advised that his symptoms may be related to dehydration.  He reports that today he is diet Pepsi and nothing else.  Recommend increasing water intake.  Return precautions including recurrence of symptoms, chest pain, shortness of breath.  He reports understanding and agreement.  Recommend that he follow-up with his cardiologist outpatient.  Patient discharged without further acute event under my care in the emergency department.         Final Clinical Impression(s) / ED Diagnoses Final diagnoses:  Palpitations    Rx / DC Orders ED Discharge Orders     None         Claretha Cooper, DO 11/20/22 2005    Derwood Kaplan, MD 11/21/22 1919

## 2022-11-20 NOTE — ED Triage Notes (Signed)
Pt to ED POV from home. Pt states he has hx of pacemaker x4 years and MI 11 years ago. Pt c/o chest "spasm" pain earlier this morning with palpations as well as a brief episode of SOB. Pt states his pacemaker monitor at home was flashing and was told to come to ER. Pt denies CP or SOB upon arrival to ED. Pt denies N/V/D.

## 2022-11-22 ENCOUNTER — Ambulatory Visit (INDEPENDENT_AMBULATORY_CARE_PROVIDER_SITE_OTHER): Payer: PPO

## 2022-11-22 DIAGNOSIS — I441 Atrioventricular block, second degree: Secondary | ICD-10-CM | POA: Diagnosis not present

## 2022-11-28 NOTE — Progress Notes (Signed)
Remote pacemaker transmission.   

## 2022-11-30 DIAGNOSIS — E1165 Type 2 diabetes mellitus with hyperglycemia: Secondary | ICD-10-CM | POA: Diagnosis not present

## 2022-11-30 DIAGNOSIS — I70223 Atherosclerosis of native arteries of extremities with rest pain, bilateral legs: Secondary | ICD-10-CM | POA: Diagnosis not present

## 2022-11-30 DIAGNOSIS — E119 Type 2 diabetes mellitus without complications: Secondary | ICD-10-CM | POA: Diagnosis not present

## 2022-11-30 DIAGNOSIS — I2510 Atherosclerotic heart disease of native coronary artery without angina pectoris: Secondary | ICD-10-CM | POA: Diagnosis not present

## 2022-11-30 DIAGNOSIS — I10 Essential (primary) hypertension: Secondary | ICD-10-CM | POA: Diagnosis not present

## 2022-11-30 DIAGNOSIS — E7849 Other hyperlipidemia: Secondary | ICD-10-CM | POA: Diagnosis not present

## 2022-11-30 DIAGNOSIS — E038 Other specified hypothyroidism: Secondary | ICD-10-CM | POA: Diagnosis not present

## 2022-11-30 DIAGNOSIS — D511 Vitamin B12 deficiency anemia due to selective vitamin B12 malabsorption with proteinuria: Secondary | ICD-10-CM | POA: Diagnosis not present

## 2022-11-30 DIAGNOSIS — E785 Hyperlipidemia, unspecified: Secondary | ICD-10-CM | POA: Diagnosis not present

## 2022-11-30 DIAGNOSIS — E559 Vitamin D deficiency, unspecified: Secondary | ICD-10-CM | POA: Diagnosis not present

## 2022-11-30 DIAGNOSIS — D518 Other vitamin B12 deficiency anemias: Secondary | ICD-10-CM | POA: Diagnosis not present

## 2022-11-30 DIAGNOSIS — E782 Mixed hyperlipidemia: Secondary | ICD-10-CM | POA: Diagnosis not present

## 2022-12-06 NOTE — Progress Notes (Signed)
Remote pacemaker transmission.   

## 2022-12-14 DIAGNOSIS — H43812 Vitreous degeneration, left eye: Secondary | ICD-10-CM | POA: Diagnosis not present

## 2022-12-14 DIAGNOSIS — E113591 Type 2 diabetes mellitus with proliferative diabetic retinopathy without macular edema, right eye: Secondary | ICD-10-CM | POA: Diagnosis not present

## 2022-12-14 DIAGNOSIS — H35371 Puckering of macula, right eye: Secondary | ICD-10-CM | POA: Diagnosis not present

## 2022-12-14 DIAGNOSIS — H2512 Age-related nuclear cataract, left eye: Secondary | ICD-10-CM | POA: Diagnosis not present

## 2022-12-14 DIAGNOSIS — H35372 Puckering of macula, left eye: Secondary | ICD-10-CM | POA: Diagnosis not present

## 2022-12-14 DIAGNOSIS — Z9889 Other specified postprocedural states: Secondary | ICD-10-CM | POA: Diagnosis not present

## 2022-12-14 DIAGNOSIS — H35033 Hypertensive retinopathy, bilateral: Secondary | ICD-10-CM | POA: Diagnosis not present

## 2022-12-14 DIAGNOSIS — H4311 Vitreous hemorrhage, right eye: Secondary | ICD-10-CM | POA: Diagnosis not present

## 2023-01-02 DIAGNOSIS — J40 Bronchitis, not specified as acute or chronic: Secondary | ICD-10-CM | POA: Diagnosis not present

## 2023-01-02 DIAGNOSIS — J018 Other acute sinusitis: Secondary | ICD-10-CM | POA: Diagnosis not present

## 2023-01-04 DIAGNOSIS — E038 Other specified hypothyroidism: Secondary | ICD-10-CM | POA: Diagnosis not present

## 2023-01-04 DIAGNOSIS — I70223 Atherosclerosis of native arteries of extremities with rest pain, bilateral legs: Secondary | ICD-10-CM | POA: Diagnosis not present

## 2023-01-04 DIAGNOSIS — E782 Mixed hyperlipidemia: Secondary | ICD-10-CM | POA: Diagnosis not present

## 2023-01-04 DIAGNOSIS — E785 Hyperlipidemia, unspecified: Secondary | ICD-10-CM | POA: Diagnosis not present

## 2023-01-04 DIAGNOSIS — E559 Vitamin D deficiency, unspecified: Secondary | ICD-10-CM | POA: Diagnosis not present

## 2023-01-04 DIAGNOSIS — D518 Other vitamin B12 deficiency anemias: Secondary | ICD-10-CM | POA: Diagnosis not present

## 2023-01-04 DIAGNOSIS — I10 Essential (primary) hypertension: Secondary | ICD-10-CM | POA: Diagnosis not present

## 2023-01-04 DIAGNOSIS — E119 Type 2 diabetes mellitus without complications: Secondary | ICD-10-CM | POA: Diagnosis not present

## 2023-01-04 DIAGNOSIS — I2510 Atherosclerotic heart disease of native coronary artery without angina pectoris: Secondary | ICD-10-CM | POA: Diagnosis not present

## 2023-01-04 DIAGNOSIS — D511 Vitamin B12 deficiency anemia due to selective vitamin B12 malabsorption with proteinuria: Secondary | ICD-10-CM | POA: Diagnosis not present

## 2023-01-04 DIAGNOSIS — E7849 Other hyperlipidemia: Secondary | ICD-10-CM | POA: Diagnosis not present

## 2023-01-04 DIAGNOSIS — E1165 Type 2 diabetes mellitus with hyperglycemia: Secondary | ICD-10-CM | POA: Diagnosis not present

## 2023-01-23 DIAGNOSIS — I2510 Atherosclerotic heart disease of native coronary artery without angina pectoris: Secondary | ICD-10-CM | POA: Diagnosis not present

## 2023-01-23 DIAGNOSIS — E559 Vitamin D deficiency, unspecified: Secondary | ICD-10-CM | POA: Diagnosis not present

## 2023-01-23 DIAGNOSIS — Z1211 Encounter for screening for malignant neoplasm of colon: Secondary | ICD-10-CM | POA: Diagnosis not present

## 2023-01-23 DIAGNOSIS — J309 Allergic rhinitis, unspecified: Secondary | ICD-10-CM | POA: Diagnosis not present

## 2023-01-23 DIAGNOSIS — E1165 Type 2 diabetes mellitus with hyperglycemia: Secondary | ICD-10-CM | POA: Diagnosis not present

## 2023-01-23 DIAGNOSIS — K219 Gastro-esophageal reflux disease without esophagitis: Secondary | ICD-10-CM | POA: Diagnosis not present

## 2023-01-25 DIAGNOSIS — H35033 Hypertensive retinopathy, bilateral: Secondary | ICD-10-CM | POA: Diagnosis not present

## 2023-01-25 DIAGNOSIS — H35372 Puckering of macula, left eye: Secondary | ICD-10-CM | POA: Diagnosis not present

## 2023-01-25 DIAGNOSIS — E113591 Type 2 diabetes mellitus with proliferative diabetic retinopathy without macular edema, right eye: Secondary | ICD-10-CM | POA: Diagnosis not present

## 2023-01-25 DIAGNOSIS — H2512 Age-related nuclear cataract, left eye: Secondary | ICD-10-CM | POA: Diagnosis not present

## 2023-01-25 DIAGNOSIS — H43812 Vitreous degeneration, left eye: Secondary | ICD-10-CM | POA: Diagnosis not present

## 2023-01-28 ENCOUNTER — Other Ambulatory Visit: Payer: Self-pay

## 2023-01-28 ENCOUNTER — Encounter (HOSPITAL_COMMUNITY): Payer: Self-pay

## 2023-01-28 ENCOUNTER — Emergency Department (HOSPITAL_COMMUNITY): Payer: PPO

## 2023-01-28 ENCOUNTER — Emergency Department (HOSPITAL_COMMUNITY)
Admission: EM | Admit: 2023-01-28 | Discharge: 2023-01-28 | Disposition: A | Discharge status: Home / Self Care | Payer: PPO | Attending: Emergency Medicine | Admitting: Emergency Medicine

## 2023-01-28 DIAGNOSIS — R0602 Shortness of breath: Secondary | ICD-10-CM | POA: Insufficient documentation

## 2023-01-28 DIAGNOSIS — I2510 Atherosclerotic heart disease of native coronary artery without angina pectoris: Secondary | ICD-10-CM | POA: Diagnosis not present

## 2023-01-28 DIAGNOSIS — Z794 Long term (current) use of insulin: Secondary | ICD-10-CM | POA: Diagnosis not present

## 2023-01-28 DIAGNOSIS — Z950 Presence of cardiac pacemaker: Secondary | ICD-10-CM | POA: Diagnosis not present

## 2023-01-28 DIAGNOSIS — Z7902 Long term (current) use of antithrombotics/antiplatelets: Secondary | ICD-10-CM | POA: Diagnosis not present

## 2023-01-28 DIAGNOSIS — Z7982 Long term (current) use of aspirin: Secondary | ICD-10-CM | POA: Diagnosis not present

## 2023-01-28 LAB — CBC WITH DIFFERENTIAL/PLATELET
Abs Immature Granulocytes: 0.03 10*3/uL (ref 0.00–0.07)
Basophils Absolute: 0 10*3/uL (ref 0.0–0.1)
Basophils Relative: 1 %
Eosinophils Absolute: 0.1 10*3/uL (ref 0.0–0.5)
Eosinophils Relative: 2 %
HCT: 43.4 % (ref 39.0–52.0)
Hemoglobin: 14 g/dL (ref 13.0–17.0)
Immature Granulocytes: 1 %
Lymphocytes Relative: 27 %
Lymphs Abs: 1.4 10*3/uL (ref 0.7–4.0)
MCH: 30.4 pg (ref 26.0–34.0)
MCHC: 32.3 g/dL (ref 30.0–36.0)
MCV: 94.1 fL (ref 80.0–100.0)
Monocytes Absolute: 0.5 10*3/uL (ref 0.1–1.0)
Monocytes Relative: 10 %
Neutro Abs: 3.1 10*3/uL (ref 1.7–7.7)
Neutrophils Relative %: 59 %
Platelets: 223 10*3/uL (ref 150–400)
RBC: 4.61 MIL/uL (ref 4.22–5.81)
RDW: 12.8 % (ref 11.5–15.5)
WBC: 5.2 10*3/uL (ref 4.0–10.5)
nRBC: 0 % (ref 0.0–0.2)

## 2023-01-28 LAB — COMPREHENSIVE METABOLIC PANEL
ALT: 14 U/L (ref 0–44)
AST: 21 U/L (ref 15–41)
Albumin: 3.3 g/dL — ABNORMAL LOW (ref 3.5–5.0)
Alkaline Phosphatase: 68 U/L (ref 38–126)
Anion gap: 10 (ref 5–15)
BUN: 11 mg/dL (ref 8–23)
CO2: 22 mmol/L (ref 22–32)
Calcium: 8.8 mg/dL — ABNORMAL LOW (ref 8.9–10.3)
Chloride: 104 mmol/L (ref 98–111)
Creatinine, Ser: 1.06 mg/dL (ref 0.61–1.24)
GFR, Estimated: >60 mL/min (ref >60)
Glucose, Bld: 268 mg/dL — ABNORMAL HIGH (ref 70–99)
Potassium: 4.2 mmol/L (ref 3.5–5.1)
Sodium: 136 mmol/L (ref 135–145)
Total Bilirubin: 1.4 mg/dL — ABNORMAL HIGH (ref <1.2)
Total Protein: 6.4 g/dL — ABNORMAL LOW (ref 6.5–8.1)

## 2023-01-28 LAB — TROPONIN I (HIGH SENSITIVITY)
Troponin I (High Sensitivity): 5 ng/L (ref <18)
Troponin I (High Sensitivity): 5 ng/L (ref <18)

## 2023-01-28 LAB — BRAIN NATRIURETIC PEPTIDE: B Natriuretic Peptide: 84.5 pg/mL (ref 0.0–100.0)

## 2023-01-28 NOTE — ED Triage Notes (Signed)
Pt c.o sudden onset of sob around 930 this morning after eating breakfast. Pt also c.o sinus infection recently. Resp e.u at this time.

## 2023-01-28 NOTE — Discharge Instructions (Addendum)
We saw you in the emergency room for shortness of breath.  The workup in the emergency room is reassuring. No evidence of heart failure, arrhythmia, heart attack, infection.  Your lung exam was normal.  We did not see any anemia.  At this time we recommend close follow-up with your PCP.  We also recommend that you keep a log of your symptoms and the surrounding circumstances to see if there is a specific trigger.

## 2023-01-28 NOTE — ED Provider Notes (Signed)
[place] EMERGENCY DEPARTMENT AT [facility] Provider Note   CSN: [id number] Arrival date & time: 01/28/23  1048     History  Chief Complaint  Patient presents with   Shortness of Breath    Jay Hanson is a 72 y.o. male.  HPI    72 year old male comes in with chief complaint of shortness of breath.  He is accompanied by his son and daughter-in-law.  According to the patient, he woke up this morning had breakfast and about 10 minutes later he was sitting and watching TV when suddenly he started feeling short of breath.  Shortness of breath is described as heavy respirations and putting work to get a breath in.  That episode lasted for about 10 or 15 minutes.  He called for help, and family brought him to the ER.  He indicates that prior to the shortness of breath he felt like there was a " hole in his abdomen" but he had no abdominal pain or discomfort.  He had a similar episode few months back as well.  Patient has past medical history of CAD status post PCI.  He however does not have any underlying lung disease, CHF and has no history of PE, DVT.  Review of system is also positive for bilateral hand tingling.  In all the episode lasted 5 to 10 minutes and resolved on its own.  Currently patient has no shortness of breath or tingling.  He has had some episodes of palpitations.  He has a pacemaker in place.  Home Medications Prior to Admission medications   Medication Sig Start Date End Date Taking? Authorizing Provider  aspirin EC 81 MG tablet Take 81 mg by mouth daily.    [provider]  carvedilol (COREG) 6.25 MG tablet Take 3.125 mg by mouth in the morning, at noon, in the evening, and at bedtime.    [provider]  cetirizine (ZYRTEC) 10 MG tablet Take 10 mg by mouth daily.  06/10/18 11/22/20  [provider]  Cholecalciferol (VITAMIN D3) 125 MCG (5000 UT) TABS Take 5,000 Units by mouth daily.    [provider]  clopidogrel  (PLAVIX) 75 MG tablet Take 75 mg by mouth daily.    [provider]  glucose blood test strip USE TO CHECK GLUCOSE 4 TIMES DAILY 06/11/18   [provider]  insulin aspart protamine- aspart (NOVOLOG MIX 70/30) (70-30) 100 UNIT/ML injection Inject 0-100 Units into the skin as needed (for blood sugar).    [provider]  insulin detemir (LEVEMIR) 100 UNIT/ML injection Inject 60 Units into the skin 2 (two) times daily.     [provider]  levocetirizine (XYZAL) 5 MG tablet Take 5 mg by mouth every evening.    [provider]  pantoprazole (PROTONIX) 40 MG tablet Take 40 mg by mouth daily. 08/20/18   [provider]  sitaGLIPtin (JANUVIA) 100 MG tablet Take 100 mg by mouth daily.    [provider]      Allergies    Azithromycin and Canagliflozin    Review of Systems   Review of Systems  All other systems reviewed and are negative.   Physical Exam Updated Vital Signs BP 131/77 (BP Location: Right Arm)   Pulse (!) 59   Temp (!) 97.3 F (36.3 C) (Oral)   Resp (!) 24   Ht 5\' 8"  (1.727 m)   Wt 113.4 kg   SpO2 98%   BMI 38.01 kg/m  Physical Exam  Vitals and nursing note reviewed.  Constitutional:      Appearance: He is well-developed.  HENT:     Head: Atraumatic.  Neck:     Vascular: No JVD.  Cardiovascular:     Rate and Rhythm: Normal rate.  Pulmonary:     Effort: Pulmonary effort is normal. No tachypnea.     Breath sounds: Decreased breath sounds present. No wheezing, rhonchi or rales.  Abdominal:     Comments: Distended abdomen, but no tenderness  Musculoskeletal:     Cervical back: Neck supple.  Skin:    General: Skin is warm.  Neurological:     Mental Status: He is alert and oriented to person, place, and time.     ED Results / Procedures / Treatments   Labs (all labs ordered are listed, but only abnormal results are displayed) Labs Reviewed  COMPREHENSIVE METABOLIC PANEL - Abnormal; Notable for the  following components:      Result Value   Glucose, Bld 268 (*)    Calcium 8.8 (*)    Total Protein 6.4 (*)    Albumin 3.3 (*)    Total Bilirubin 1.4 (*)    All other components within normal limits  CBC WITH DIFFERENTIAL/PLATELET  BRAIN NATRIURETIC PEPTIDE  TROPONIN I (HIGH SENSITIVITY)  TROPONIN I (HIGH SENSITIVITY)    EKG EKG Interpretation Date/Time:  Sunday January 28 2023 10:58:00 EST Ventricular Rate:  64 PR Interval:    QRS Duration:  163 QT Interval:  438 QTC Calculation: 452 R Axis:   -89  Text Interpretation: Atrial-sensed ventricular-paced rhythm Nonspecific IVCD with LAD Probable inferior infarct, acute Anterolateral infarct, old No acute changes Confirmed by Derwood Kaplan ([id number]) on 01/28/2023 12:17:24 PM  Radiology DG Chest 2 View  Result Date: 01/28/2023 CLINICAL DATA:  Shortness of breath. EXAM: CHEST - 2 VIEW COMPARISON:  11/20/2022. FINDINGS: Bilateral lung fields are clear. Bilateral costophrenic angles are clear. Normal cardio-mediastinal silhouette. There is a left sided 2-lead pacemaker. No acute osseous abnormalities. The soft tissues are within normal limits. IMPRESSION: *No active cardiopulmonary disease. Electronically Signed   By: Jules Schick M.D.   On: 01/28/2023 13:58    Procedures Procedures    Medications Ordered in ED Medications - No data to display  ED Course/ Medical Decision Making/ A&P                                 Medical Decision Making Amount and/or Complexity of Data Reviewed Labs: ordered. Radiology: ordered.   This patient presents to the ED with chief complaint(s) of shortness of breath with pertinent past medical history of CAD status post PCI and cardiac pacemaker.The complaint involves an extensive differential diagnosis and also carries with it a high risk of complications and morbidity.    The differential diagnosis includes : Acute pulmonary edema, congestive heart failure, valvular disorder, severe anemia,  anxiety/panic attack leading to tingling, severe anemia, allergic reaction, abdominal bloating.  History is not indicative of any specific source.  Patient had a spontaneous episode of shortness of breath that resolved on its own.  His exam is overall reassuring.  His history is reassuring.  Specifically patient denies any exertional chest pain or shortness of breath and was cutting wood yesterday without any difficulty.  He is concerned that perhaps his allergies from having his dog might have triggered this, but I suspect that is unlikely to be the primary cause.  The initial plan  is to get basic labs including CBC, metabolic profile and BNP.  Patient has no signs of DVT, no risk factors for PE/DVT.  I do not think patient is a D-dimer evaluation.  His vital signs are stable, heart rate in the 60s and O2 sats 100% right now.   Additional history obtained: Additional history obtained from family Records reviewed  previous records with cardiology, patient has [facility] pacemaker.  Independent labs interpretation:  The following labs were independently interpreted: Patient's BNP is in the 80s.  He has no anemia.  Initial troponin is normal.  I have placed patient on cardiac telemetry monitoring.  I reviewed patient's telemetry monitoring and he remains paced.  No arrhythmia here.  We have requested [facility] Jude pacemaker device to be interrogated.  Independent visualization and interpretation of imaging: - I independently visualized the following imaging with scope of interpretation limited to determining acute life threatening conditions related to emergency care: X-ray of the chest, which revealed no evidence of pulmonary edema, pleural effusion.  Treatment and Reassessment: Patient reassessed.  Results of the ER workup discussed with him.  I have reviewed patient's cardiac telemonitoring.  He has not had any arrhythmias.  I have also reviewed the rhythm strip from the pacemaker company.   Patient has not had any complications per interrogation as well.  He is stable for discharge.   Final Clinical Impression(s) / ED Diagnoses Final diagnoses:  Shortness of breath    Rx / DC Orders ED Discharge Orders     None         Derwood Kaplan, MD 01/28/23 1538

## 2023-01-30 DIAGNOSIS — E559 Vitamin D deficiency, unspecified: Secondary | ICD-10-CM | POA: Diagnosis not present

## 2023-01-30 DIAGNOSIS — E1165 Type 2 diabetes mellitus with hyperglycemia: Secondary | ICD-10-CM | POA: Diagnosis not present

## 2023-01-30 DIAGNOSIS — I2510 Atherosclerotic heart disease of native coronary artery without angina pectoris: Secondary | ICD-10-CM | POA: Diagnosis not present

## 2023-02-21 ENCOUNTER — Ambulatory Visit (INDEPENDENT_AMBULATORY_CARE_PROVIDER_SITE_OTHER): Payer: PPO

## 2023-02-21 DIAGNOSIS — I441 Atrioventricular block, second degree: Secondary | ICD-10-CM | POA: Diagnosis not present

## 2023-02-22 LAB — CUP PACEART REMOTE DEVICE CHECK
Battery Remaining Longevity: 70 mo
Battery Remaining Percentage: 57 %
Battery Voltage: 2.99 V
Brady Statistic AP VP Percent: 91 %
Brady Statistic AP VS Percent: 1 %
Brady Statistic AS VP Percent: 8.7 %
Brady Statistic AS VS Percent: 1 %
Brady Statistic RA Percent Paced: 91 %
Brady Statistic RV Percent Paced: 99 %
Date Time Interrogation Session: 20241219015021
Implantable Lead Connection Status: 753985
Implantable Lead Connection Status: 753985
Implantable Lead Implant Date: 20200731
Implantable Lead Implant Date: 20200731
Implantable Lead Location: 753859
Implantable Lead Location: 753860
Implantable Pulse Generator Implant Date: 20200731
Lead Channel Impedance Value: 380 Ohm
Lead Channel Impedance Value: 700 Ohm
Lead Channel Pacing Threshold Amplitude: 0.5 V
Lead Channel Pacing Threshold Amplitude: 0.5 V
Lead Channel Pacing Threshold Pulse Width: 0.4 ms
Lead Channel Pacing Threshold Pulse Width: 0.5 ms
Lead Channel Sensing Intrinsic Amplitude: 5 mV
Lead Channel Sensing Intrinsic Amplitude: 9 mV
Lead Channel Setting Pacing Amplitude: 0.75 V
Lead Channel Setting Pacing Amplitude: 1.5 V
Lead Channel Setting Pacing Pulse Width: 0.5 ms
Lead Channel Setting Sensing Sensitivity: 2 mV
Pulse Gen Model: 2272
Pulse Gen Serial Number: 9152674

## 2023-02-26 DIAGNOSIS — I2510 Atherosclerotic heart disease of native coronary artery without angina pectoris: Secondary | ICD-10-CM | POA: Diagnosis not present

## 2023-02-26 DIAGNOSIS — E1165 Type 2 diabetes mellitus with hyperglycemia: Secondary | ICD-10-CM | POA: Diagnosis not present

## 2023-02-26 DIAGNOSIS — E559 Vitamin D deficiency, unspecified: Secondary | ICD-10-CM | POA: Diagnosis not present

## 2023-03-01 DIAGNOSIS — I10 Essential (primary) hypertension: Secondary | ICD-10-CM | POA: Diagnosis not present

## 2023-03-05 DIAGNOSIS — E113592 Type 2 diabetes mellitus with proliferative diabetic retinopathy without macular edema, left eye: Secondary | ICD-10-CM | POA: Diagnosis not present

## 2023-03-29 DIAGNOSIS — I2510 Atherosclerotic heart disease of native coronary artery without angina pectoris: Secondary | ICD-10-CM | POA: Diagnosis not present

## 2023-03-29 DIAGNOSIS — E1165 Type 2 diabetes mellitus with hyperglycemia: Secondary | ICD-10-CM | POA: Diagnosis not present

## 2023-03-29 DIAGNOSIS — E559 Vitamin D deficiency, unspecified: Secondary | ICD-10-CM | POA: Diagnosis not present

## 2023-04-02 NOTE — Addendum Note (Signed)
Addended by: Geralyn Flash D on: 04/02/2023 04:54 PM   Modules accepted: Orders

## 2023-04-02 NOTE — Progress Notes (Signed)
Remote pacemaker transmission.

## 2023-05-01 DIAGNOSIS — I10 Essential (primary) hypertension: Secondary | ICD-10-CM | POA: Diagnosis not present

## 2023-05-02 DIAGNOSIS — E1165 Type 2 diabetes mellitus with hyperglycemia: Secondary | ICD-10-CM | POA: Diagnosis not present

## 2023-05-02 DIAGNOSIS — E559 Vitamin D deficiency, unspecified: Secondary | ICD-10-CM | POA: Diagnosis not present

## 2023-05-02 DIAGNOSIS — I2510 Atherosclerotic heart disease of native coronary artery without angina pectoris: Secondary | ICD-10-CM | POA: Diagnosis not present

## 2023-05-03 DIAGNOSIS — K219 Gastro-esophageal reflux disease without esophagitis: Secondary | ICD-10-CM | POA: Diagnosis not present

## 2023-05-03 DIAGNOSIS — I2510 Atherosclerotic heart disease of native coronary artery without angina pectoris: Secondary | ICD-10-CM | POA: Diagnosis not present

## 2023-05-03 DIAGNOSIS — E1165 Type 2 diabetes mellitus with hyperglycemia: Secondary | ICD-10-CM | POA: Diagnosis not present

## 2023-05-03 DIAGNOSIS — E559 Vitamin D deficiency, unspecified: Secondary | ICD-10-CM | POA: Diagnosis not present

## 2023-05-03 DIAGNOSIS — J309 Allergic rhinitis, unspecified: Secondary | ICD-10-CM | POA: Diagnosis not present

## 2023-05-03 DIAGNOSIS — R0989 Other specified symptoms and signs involving the circulatory and respiratory systems: Secondary | ICD-10-CM | POA: Diagnosis not present

## 2023-05-04 DIAGNOSIS — I6523 Occlusion and stenosis of bilateral carotid arteries: Secondary | ICD-10-CM | POA: Diagnosis not present

## 2023-05-04 DIAGNOSIS — R0989 Other specified symptoms and signs involving the circulatory and respiratory systems: Secondary | ICD-10-CM | POA: Diagnosis not present

## 2023-05-11 ENCOUNTER — Other Ambulatory Visit: Payer: Self-pay | Admitting: Thoracic Surgery (Cardiothoracic Vascular Surgery)

## 2023-05-11 DIAGNOSIS — I7121 Aneurysm of the ascending aorta, without rupture: Secondary | ICD-10-CM

## 2023-05-23 ENCOUNTER — Ambulatory Visit (INDEPENDENT_AMBULATORY_CARE_PROVIDER_SITE_OTHER): Payer: PPO

## 2023-05-23 DIAGNOSIS — I441 Atrioventricular block, second degree: Secondary | ICD-10-CM

## 2023-05-24 LAB — CUP PACEART REMOTE DEVICE CHECK
Battery Remaining Longevity: 67 mo
Battery Remaining Percentage: 54 %
Battery Voltage: 2.99 V
Brady Statistic AP VP Percent: 90 %
Brady Statistic AP VS Percent: 1 %
Brady Statistic AS VP Percent: 10 %
Brady Statistic AS VS Percent: 1 %
Brady Statistic RA Percent Paced: 89 %
Brady Statistic RV Percent Paced: 99 %
Date Time Interrogation Session: 20250319083039
Implantable Lead Connection Status: 753985
Implantable Lead Connection Status: 753985
Implantable Lead Implant Date: 20200731
Implantable Lead Implant Date: 20200731
Implantable Lead Location: 753859
Implantable Lead Location: 753860
Implantable Pulse Generator Implant Date: 20200731
Lead Channel Impedance Value: 380 Ohm
Lead Channel Impedance Value: 710 Ohm
Lead Channel Pacing Threshold Amplitude: 0.5 V
Lead Channel Pacing Threshold Amplitude: 0.5 V
Lead Channel Pacing Threshold Pulse Width: 0.4 ms
Lead Channel Pacing Threshold Pulse Width: 0.5 ms
Lead Channel Sensing Intrinsic Amplitude: 5 mV
Lead Channel Sensing Intrinsic Amplitude: 9 mV
Lead Channel Setting Pacing Amplitude: 0.75 V
Lead Channel Setting Pacing Amplitude: 1.5 V
Lead Channel Setting Pacing Pulse Width: 0.5 ms
Lead Channel Setting Sensing Sensitivity: 2 mV
Pulse Gen Model: 2272
Pulse Gen Serial Number: 9152674

## 2023-05-31 NOTE — Progress Notes (Deleted)
     [address]       [place] [address]             [phone]         HPI: This is a 73 year old male with a past medical history of    Current Outpatient Medications  Medication Sig Dispense Refill   aspirin EC 81 MG tablet Take 81 mg by mouth daily.     carvedilol (COREG) 6.25 MG tablet Take 3.125 mg by mouth in the morning, at noon, in the evening, and at bedtime.     cetirizine (ZYRTEC) 10 MG tablet Take 10 mg by mouth daily.      Cholecalciferol (VITAMIN D3) 125 MCG (5000 UT) TABS Take 5,000 Units by mouth daily.     clopidogrel (PLAVIX) 75 MG tablet Take 75 mg by mouth daily.     glucose blood test strip USE TO CHECK GLUCOSE 4 TIMES DAILY     insulin aspart protamine- aspart (NOVOLOG MIX 70/30) (70-30) 100 UNIT/ML injection Inject 0-100 Units into the skin as needed (for blood sugar).     insulin detemir (LEVEMIR) 100 UNIT/ML injection Inject 60 Units into the skin 2 (two) times daily.      levocetirizine (XYZAL) 5 MG tablet Take 5 mg by mouth every evening.     pantoprazole (PROTONIX) 40 MG tablet Take 40 mg by mouth daily.     sitaGLIPtin (JANUVIA) 100 MG tablet Take 100 mg by mouth daily.    Vital Signs:   Physical Exam: CV- Neck- Pulmonary- Abdomen- Extremities- Neurologic-  Diagnostic Tests:  Risk Modification in those with ascending thoracic aortic aneurysm:  Continue good control of blood pressure (prefer SBP 130/80 or less)-continue Carvedilol (Coreg)  2. Avoid fluoroquinolone antibiotics (I.e Ciprofloxacin, Avelox, Levofloxacin, Ofloxacin)  3.  Use of statin (to decrease cardiovascular risk)  4.  Exercise and activity limitations is individualized, but in general, contact sports are to be  avoided and one should avoid heavy lifting (defined as half of ideal body weight) and exercises involving sustained Valsalva maneuver.  5. Counseling for those suspected of having genetically mediated disease. First-degree relatives of  those with TAA disease should be screened as well as those who have a connective tissue disease (I.e with Marfan syndrome, Ehlers-Danlos syndrome,  and Loeys-Dietz syndrome) or a  bicuspid aortic valve,have an increased risk for  complications related to TAA. Patient with no family history of connective tissue. He has not had an echocardiogram.  6. If one has tobacco abuse, smoking cessation is highly encouraged.   Impression and Plan: *** with a *** cm ascending aortic aneurysm.  Echocardiogram shows a *** valve without evidence of regurgitation.  We discussed the natural history and and risk factors for growth of ascending aortic aneurysms.  We covered the importance of smoking cessation, tight blood pressure control, refraining from lifting heavy objects, and avoiding fluoroquinolones.  The patient is aware of signs and symptoms of aortic dissection and when to present to the emergency department.  We will continue surveillance and a repeat ** was ordered for ***.   Ardelle Balls, PA-C [facility] [phone]

## 2023-05-31 NOTE — Progress Notes (Unsigned)
 [address]       [place] [address]             [phone]   PCP is Hague, Myrene Galas, MD Referring Provider is Galvin Proffer, MD  Chief Complaint: Ascending thoracic aortic aneurysm   HPI: This is a 73 year old male with a past medical history of MI, CAD, diabetes mellitus, and bradycardia who was incidentally found to have an ATAA on CT of the chest in 2023. His brother was  found to have lung cancer which prompted him to get a CT of his chest. He was last seen by my colleague April 2024 and CTA showed mild aneurysmal dilation of the ascending thoracic aorta measuring up to 4.0 cm. He denies chest pain, pressure, or tightness.  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   Coronary artery disease    Diabetes mellitus without complication (HCC)    MI (myocardial infarction) [facility])     Past Surgical History:  Procedure Laterality Date   CARDIAC SURGERY     COLONOSCOPY     PACEMAKER IMPLANT N/A 10/04/2018   Procedure: PACEMAKER IMPLANT;  Surgeon: Regan Lemming, MD;  Location: [facility] INVASIVE CV LAB;  Service: Cardiovascular;  Laterality: N/A;    Family History  Problem Relation Age of Onset   Heart disease Brother     Social History Social History   Tobacco Use   Smoking status: Former   Smokeless tobacco: Never  Advertising account planner   Vaping status: Never Used  Substance Use Topics   Alcohol use: Never   Drug use: Never    Current Outpatient Medications  Medication Sig Dispense Refill   aspirin EC 81 MG tablet Take 81 mg by mouth daily.     carvedilol (COREG) 6.25 MG tablet Take 3.125 mg by mouth in the morning, at noon, in the evening, and at bedtime.     cetirizine (ZYRTEC) 10 MG tablet Take 10 mg by mouth daily.      Cholecalciferol (VITAMIN D3) 125 MCG (5000 UT) TABS Take 5,000 Units by mouth daily.     clopidogrel (PLAVIX) 75 MG tablet Take 75 mg by mouth daily.     glucose blood test strip USE TO CHECK GLUCOSE 4 TIMES  DAILY     insulin aspart protamine- aspart (NOVOLOG MIX 70/30) (70-30) 100 UNIT/ML injection Inject 0-100 Units into the skin as needed (for blood sugar).     insulin detemir (LEVEMIR) 100 UNIT/ML injection Inject 60 Units into the skin 2 (two) times daily.      levocetirizine (XYZAL) 5 MG tablet Take 5 mg by mouth every evening.     pantoprazole (PROTONIX) 40 MG tablet Take 40 mg by mouth daily.     sitaGLIPtin (JANUVIA) 100 MG tablet Take 100 mg by mouth daily.       Allergies  Allergen Reactions   Azithromycin Other (See Comments)    Unknown-pt does not remember reaction   Canagliflozin Nausea And Vomiting    Vital Signs:   Physical Exam: CV- Neck- Pulmonary- Abdomen- Extremities- Neurologic-   Diagnostic Tests:   Risk Modification in those with ascending thoracic aortic aneurysm:  Continue good control of blood pressure (prefer SBP 130/80 or less)-continue with Carvedilol (Coreg)  2. Avoid fluoroquinolone antibiotics (I.e Ciprofloxacin, Avelox, Levofloxacin, Ofloxacin)  3.  Use of statin (to decrease cardiovascular risk)-Last lipid profile per EMR done 06/22/2018: Total cholesterol 166, Triglycerides 143, HDL 28, and LDL 109.  Will defer to primary if/when to initiate statin.  4.  Exercise and activity limitations is individualized, but in general, contact sports are to be  avoided and one should avoid heavy lifting (defined as half of ideal body weight) and exercises involving sustained Valsalva maneuver.  5. Counseling for those suspected of having genetically mediated disease. First-degree relatives of those with TAA disease should be screened as well as those who have a connective tissue disease (I.e with Marfan syndrome, Ehlers-Danlos syndrome,  and Loeys-Dietz syndrome) or a  bicuspid aortic valve,have an increased risk for  complications related to TAA. Per patient, he has no family history of connective tissue disease or ATAA. He has not had an  echocardiogram.  6. He has a history of remote tobacco abuse.   Impression and Plan: *** with a *** cm ascending aortic aneurysm.  Echocardiogram shows a *** valve without evidence of regurgitation.  We discussed the natural history and and risk factors for growth of ascending aortic aneurysms.  We covered the importance of smoking cessation, tight blood pressure control, refraining from lifting heavy objects, and avoiding fluoroquinolones.  The patient is aware of signs and symptoms of aortic dissection and when to present to the emergency department.  We will continue surveillance and a repeat ** was ordered for ***.   Ardelle Balls, PA-C [facility] [phone]

## 2023-06-06 ENCOUNTER — Inpatient Hospital Stay: Admission: RE | Admit: 2023-06-06 | Source: Ambulatory Visit

## 2023-06-11 ENCOUNTER — Ambulatory Visit
Admission: RE | Admit: 2023-06-11 | Discharge: 2023-06-11 | Disposition: A | Discharge status: Home / Self Care | Source: Ambulatory Visit | Attending: Thoracic Surgery (Cardiothoracic Vascular Surgery) | Admitting: Thoracic Surgery (Cardiothoracic Vascular Surgery)

## 2023-06-11 DIAGNOSIS — I7121 Aneurysm of the ascending aorta, without rupture: Secondary | ICD-10-CM

## 2023-06-11 MED ORDER — IOPAMIDOL (ISOVUE-370) INJECTION 76%
75.0000 mL | Freq: Once | INTRAVENOUS | Status: AC | PRN
  Administered 2023-06-11: 75 mL via INTRAVENOUS

## 2023-06-12 ENCOUNTER — Encounter: Payer: Self-pay | Admitting: Thoracic Surgery (Cardiothoracic Vascular Surgery)

## 2023-06-12 ENCOUNTER — Ambulatory Visit

## 2023-06-25 ENCOUNTER — Ambulatory Visit: Admitting: Surgical

## 2023-06-25 DIAGNOSIS — I712 Thoracic aortic aneurysm, without rupture, unspecified: Secondary | ICD-10-CM | POA: Diagnosis not present

## 2023-06-25 NOTE — Progress Notes (Signed)
 [address]       [place] [address]             [phone]    This was a telephonic virtual visit with consent obtained from the patient. PCP is Georgean [place], MD Referring Provider is Verdia Glad, NP  Chief Complaint: Ascending thoracic aortic aneurysm   HPI: This is a 73 year old male with a past medical history of MI, CAD, diabetes mellitus, and bradycardia who was incidentally found to have an ATAA on CT of the chest in 2023. His brother was  found to have lung cancer which prompted him to get a CT of his chest. He was last seen by my colleague April 2024 and CTA showed mild aneurysmal dilation of the ascending thoracic aorta measuring up to 4.0 cm. He denies chest pain, pressure, or tightness. Most recent scan done on April 7 of this year is 3.9 cm. He remains asymptomatic in this regard. He reports that his blood pressure has been under excellent control as well as his diabetes.   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   Coronary artery disease    Diabetes mellitus without complication (HCC)    MI (myocardial infarction) [facility])     Past Surgical History:  Procedure Laterality Date   CARDIAC SURGERY     COLONOSCOPY     PACEMAKER IMPLANT N/A 10/04/2018   Procedure: PACEMAKER IMPLANT;  Surgeon: Lei Pump, MD;  Location: [facility] INVASIVE CV LAB;  Service: Cardiovascular;  Laterality: N/A;    Family History  Problem Relation Age of Onset   Heart disease Brother     Social History Social History   Tobacco Use   Smoking status: Former   Smokeless tobacco: Never  Advertising account planner   Vaping status: Never Used  Substance Use Topics   Alcohol use: Never   Drug use: Never    Current Outpatient Medications  Medication Sig Dispense Refill   aspirin  EC 81 MG tablet Take 81 mg by mouth daily.     carvedilol  (COREG ) 6.25 MG tablet Take 3.125 mg by mouth in the morning, at noon, in the evening, and at bedtime.      cetirizine (ZYRTEC) 10 MG tablet Take 10 mg by mouth daily.      Cholecalciferol (VITAMIN D3) 125 MCG (5000 UT) TABS Take 5,000 Units by mouth daily.     clopidogrel  (PLAVIX ) 75 MG tablet Take 75 mg by mouth daily.     glucose blood test strip USE TO CHECK GLUCOSE 4 TIMES DAILY     insulin  aspart protamine- aspart (NOVOLOG  MIX 70/30) (70-30) 100 UNIT/ML injection Inject 0-100 Units into the skin as needed (for blood sugar).     insulin  detemir (LEVEMIR ) 100 UNIT/ML injection Inject 60 Units into the skin 2 (two) times daily.      levocetirizine (XYZAL) 5 MG tablet Take 5 mg by mouth every evening.     pantoprazole  (PROTONIX ) 40 MG tablet Take 40 mg by mouth daily.     sitaGLIPtin (JANUVIA) 100 MG tablet Take 100 mg by mouth daily.       Allergies  Allergen Reactions   Azithromycin Other (See Comments)    Unknown-pt does not remember reaction   Canagliflozin Nausea And Vomiting    Vital Signs:   Physical Exam virtual visit no exam    Diagnostic Tests:   Risk Modification in those with ascending thoracic aortic aneurysm:  Continue good  control of blood pressure (prefer SBP 130/80 or less)-continue with Carvedilol  (Coreg )  2. Avoid fluoroquinolone antibiotics (I.e Ciprofloxacin, Avelox, Levofloxacin, Ofloxacin)  3.  Use of statin (to decrease cardiovascular risk)-Last lipid profile per EMR done 06/22/2018: Total cholesterol 166, Triglycerides 143, HDL 28, and LDL 109. Will defer to primary if/when to initiate statin.  4.  Exercise and activity limitations is individualized, but in general, contact sports are to be  avoided and one should avoid heavy lifting (defined as half of ideal body weight) and exercises involving sustained Valsalva maneuver.  5. Counseling for those suspected of having genetically mediated disease. First-degree relatives of those with TAA disease should be screened as well as those who have a connective tissue disease (I.e with Marfan syndrome,  Ehlers-Danlos syndrome,  and Loeys-Dietz syndrome) or a  bicuspid aortic valve,have an increased risk for  complications related to TAA. Per patient, he has no family history of connective tissue disease or ATAA. He has not had an echocardiogram.  6. He has a history of remote tobacco abuse.   Impression and Plan: 10 old male with a 3.9 cm ascending aortic aneurysm.  We discussed the natural history and and risk factors for growth of ascending aortic aneurysms.  We covered the importance of smoking cessation, tight blood pressure control, refraining from lifting heavy objects, and avoiding fluoroquinolones.  The patient is aware of signs and symptoms of aortic dissection and when to present to the emergency department.  We will continue surveillance and a repeat chest CTA was ordered for 2 years as this is a very borderline study at this point..   Jamel Dunton E Tiphany Fayson, PA-C [facility] [phone]

## 2023-06-25 NOTE — Patient Instructions (Signed)
 Continue excellent control of blood pressure and medical conditions

## 2023-06-26 ENCOUNTER — Ambulatory Visit: Attending: Cardiology | Admitting: Cardiology

## 2023-06-27 ENCOUNTER — Encounter: Payer: Self-pay | Admitting: Cardiology

## 2023-07-06 NOTE — Addendum Note (Signed)
 Addended by: Lott Rouleau A on: 07/06/2023 01:19 PM   Modules accepted: Orders

## 2023-07-06 NOTE — Progress Notes (Signed)
 Remote pacemaker transmission.

## 2023-07-16 ENCOUNTER — Encounter: Payer: Self-pay | Admitting: Cardiology

## 2023-07-16 ENCOUNTER — Ambulatory Visit: Attending: Cardiology | Admitting: Cardiology

## 2023-07-16 VITALS — BP 114/64 | HR 62 | Ht 68.0 in | Wt 251.0 lb

## 2023-07-16 DIAGNOSIS — I459 Conduction disorder, unspecified: Secondary | ICD-10-CM

## 2023-07-16 DIAGNOSIS — I491 Atrial premature depolarization: Secondary | ICD-10-CM | POA: Diagnosis not present

## 2023-07-16 DIAGNOSIS — R001 Bradycardia, unspecified: Secondary | ICD-10-CM | POA: Diagnosis not present

## 2023-07-16 DIAGNOSIS — I441 Atrioventricular block, second degree: Secondary | ICD-10-CM | POA: Diagnosis not present

## 2023-07-16 LAB — CUP PACEART INCLINIC DEVICE CHECK
Battery Remaining Longevity: 62 mo
Battery Voltage: 2.99 V
Brady Statistic RA Percent Paced: 89 %
Brady Statistic RV Percent Paced: 99.87 %
Date Time Interrogation Session: 20250512164500
Implantable Lead Connection Status: 753985
Implantable Lead Connection Status: 753985
Implantable Lead Implant Date: 20200731
Implantable Lead Implant Date: 20200731
Implantable Lead Location: 753859
Implantable Lead Location: 753860
Implantable Pulse Generator Implant Date: 20200731
Lead Channel Impedance Value: 375 Ohm
Lead Channel Impedance Value: 712.5 Ohm
Lead Channel Pacing Threshold Amplitude: 0.5 V
Lead Channel Pacing Threshold Amplitude: 0.5 V
Lead Channel Pacing Threshold Amplitude: 0.75 V
Lead Channel Pacing Threshold Amplitude: 0.75 V
Lead Channel Pacing Threshold Pulse Width: 0.4 ms
Lead Channel Pacing Threshold Pulse Width: 0.4 ms
Lead Channel Pacing Threshold Pulse Width: 0.5 ms
Lead Channel Pacing Threshold Pulse Width: 0.5 ms
Lead Channel Sensing Intrinsic Amplitude: 10.8 mV
Lead Channel Sensing Intrinsic Amplitude: 3.1 mV
Lead Channel Setting Pacing Amplitude: 0.75 V
Lead Channel Setting Pacing Amplitude: 1.5 V
Lead Channel Setting Pacing Pulse Width: 0.5 ms
Lead Channel Setting Sensing Sensitivity: 2 mV
Pulse Gen Model: 2272
Pulse Gen Serial Number: 9152674

## 2023-07-16 NOTE — Progress Notes (Signed)
  Electrophysiology Office Note:   Date:  07/16/2023  ID:  Jay Hanson, DOB 12/06/1950, MRN [id number]  Primary Cardiologist: None Primary Heart Failure: None Electrophysiologist: Zubayr Bednarczyk [place] Ding, MD      History of Present Illness:   Jay Hanson is a 73 y.o. male with h/o hypertension, diabetes, coronary artery disease post STEMI with PCI in 2014 and cardiac arrest, junctional bradycardia, second degree AV block seen today for routine electrophysiology followup.   Since last being seen in our clinic the patient reports doing well.  He has no chest pain or shortness of breath.  He is able to do all of his daily activities.  He has had 3 episodes of atrial fibrillation lasting around 2 hours.  He is not symptomatic from these.  he denies chest pain, palpitations, dyspnea, PND, orthopnea, nausea, vomiting, dizziness, syncope, edema, weight gain, or early satiety.   Review of systems complete and found to be negative unless listed in HPI.      EP Information / Studies Reviewed:    EKG is ordered today. Personal review as below.  EKG Interpretation Date/Time:  Monday Jul 16 2023 15:46:10 EDT Ventricular Rate:  60 PR Interval:  188 QRS Duration:  180 QT Interval:  462 QTC Calculation: 462 R Axis:   -81  Text Interpretation: AV dual-paced rhythm When compared with ECG of 28-Jan-2023 10:58, No significant change since last tracing Confirmed by Sanaa Zilberman ([id number]) on 07/16/2023 3:49:32 PM   PPM Interrogation-  reviewed in detail today,  See PACEART report.  Device History: Abbott Dual Chamber PPM implanted 10/04/2018 for Second Degree AV block  Risk Assessment/Calculations:           Physical Exam:   VS:  There were no vitals taken for this visit.   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  Coronary artery disease: Post PCI in 2014.  No current chest pain  3.  Hypertension: Well-controlled  4.  Subclinical atrial fibrillation: Having a few hours worth of atrial fibrillation.  This is occurred 3 times.  No indication for anticoagulation at this time.  Lash Matulich have him follow-up with general cardiology in [place] to establish for coronary disease  Disposition:   Follow up with Dr. Lawana Pray in 12 months  Signed, Coyt Govoni [place] Ding, MD

## 2023-07-16 NOTE — Patient Instructions (Signed)
 Medication Instructions:  Your physician recommends that you continue on your current medications as directed. Please refer to the Current Medication list given to you today.  *If you need a refill on your cardiac medications before your next appointment, please call your pharmacy*  Follow-Up: At [facility], you and your health needs are our priority.  As part of our continuing mission to provide you with exceptional heart care, our providers are all part of one team.  This team includes your primary Cardiologist (physician) and Advanced Practice Providers or APPs (Physician Assistants and Nurse Practitioners) who all work together to provide you with the care you need, when you need it.  Your next appointment:    6 months with General Cardiology   1 year with Dr. Lawana Pray

## 2023-07-17 ENCOUNTER — Ambulatory Visit: Payer: Self-pay | Admitting: Cardiology

## 2023-07-24 ENCOUNTER — Other Ambulatory Visit: Payer: Self-pay

## 2023-07-24 DIAGNOSIS — I219 Acute myocardial infarction, unspecified: Secondary | ICD-10-CM | POA: Insufficient documentation

## 2023-07-24 DIAGNOSIS — E119 Type 2 diabetes mellitus without complications: Secondary | ICD-10-CM | POA: Insufficient documentation

## 2023-07-26 ENCOUNTER — Ambulatory Visit

## 2023-08-10 ENCOUNTER — Ambulatory Visit

## 2023-08-10 VITALS — BP 126/84 | HR 60 | Ht 68.0 in | Wt 254.0 lb

## 2023-08-10 DIAGNOSIS — I480 Paroxysmal atrial fibrillation: Secondary | ICD-10-CM

## 2023-08-10 DIAGNOSIS — Z950 Presence of cardiac pacemaker: Secondary | ICD-10-CM | POA: Diagnosis not present

## 2023-08-10 DIAGNOSIS — I4891 Unspecified atrial fibrillation: Secondary | ICD-10-CM | POA: Insufficient documentation

## 2023-08-10 DIAGNOSIS — E782 Mixed hyperlipidemia: Secondary | ICD-10-CM

## 2023-08-10 DIAGNOSIS — I2510 Atherosclerotic heart disease of native coronary artery without angina pectoris: Secondary | ICD-10-CM

## 2023-08-10 MED ORDER — METOPROLOL TARTRATE 25 MG PO TABS: 12.5000 mg | ORAL_TABLET | Freq: Two times a day (BID) | ORAL | 3 refills | Status: AC

## 2023-08-10 NOTE — Assessment & Plan Note (Addendum)
 Paroxysmal low burden atrial fibrillation device identified per pacemaker interrogation and reviewed with the electrophysiologist Dr. Lawana Pray and given lack of significant burden at this time not on anticoagulation. Appears to show about 5 episodes since February 2025 of mode switches consistent with atrial fibrillation with at least 3 episodes lasting longer than 2 hours.  Continue to follow-up closely with device checks and if significant atrial fibrillation burden, would consider anticoagulation given CHADS2 Vasc score of 3.  Currently remains in AV dual paced rhythm at the office visit. Denies any symptom of palpitations suggestive of tachyarrhythmias.

## 2023-08-10 NOTE — Progress Notes (Signed)
 Cardiology Consultation:    Date:  08/10/2023   ID:  [name], DOB 11/08/1950, MRN [id number]  PCP:  Georgean Kindle, MD  Cardiologist:  Daymon Evans Moriah Loughry, MD   Referring MD: Lei Pump, MD   Chief Complaint  Patient presents with   Medication Management    Metoprolol    Pacemaker Check     ASSESSMENT AND PLAN:   Mr. 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  Mentions had echocardiogram in the past 6 months to his PCPs office at [facility] internal medicine along with blood work, results of which are not available for my review at this time and will request from his PCPs office.  He had establish care with general cardiology and overall doing well.  Problem List Items Addressed This Visit     CAD (coronary artery disease) - Primary   history of CAD s/p STEMI complicated by cardiac arrest underwent PCI in 2014 [cath report not available for my review at this time], subsequent stress test with nuclear imaging was 2017 [results not available for my review], last echocardiogram is from April 2020 at [facility] health  reporting EF 55 to 60% with basal to mid inferolateral hypokinesis  Request results of echocardiogram done at PCPs office with patient in the last 6 months at [facility].  Asymptomatic CCS class I. Continue with Plavix  75 mg once daily.  He is concerned about metoprolol  tartrate causing sexual dysfunction. Discussed weaning it off. He did not have any recent MI, not in heart failure. Continue with metoprolol  tartrate 12.5 mg, reduce the dose to twice daily for the next 1 month and subsequently if doing well, discontinue.  Is currently not on a statin, unclear the reason. No recent lipid panel available. Will request these results from his PCPs office.  Given his history of CAD and MI, should be on statin. If no significant abnormalities on recent LFTs, will recommend starting Crestor 10 mg once daily.      Relevant Medications   metoprolol  tartrate (LOPRESSOR ) 25 MG tablet   Other Relevant Orders   EKG 12-Lead (Completed)   HLD (hyperlipidemia)   Will review blood work from PCPs office once available. If no significant LFT abnormalities, will recommend starting Crestor 10 mg once daily.       Relevant Medications   metoprolol  tartrate (LOPRESSOR ) 25 MG tablet   Cardiac pacemaker   S/p dual-chamber pacemaker implant 10-04-2018. Last device check 07-16-2023 with appropriate functioning. Continues to follow-up with device clinic and Dr. Lawana Pray.       Atrial fibrillation (HCC)   Paroxysmal low burden atrial fibrillation device identified per pacemaker interrogation and reviewed with the electrophysiologist Dr. Lawana Pray and given lack of significant burden  at this time not on anticoagulation. Appears to show about 5 episodes since February 2025 of mode switches consistent with atrial fibrillation with at least 3 episodes lasting longer than 2 hours.  Continue to follow-up closely with device checks and if significant atrial fibrillation burden, would consider  anticoagulation given CHADS2 Vasc score of 3.  Currently remains in AV dual paced rhythm at the office visit. Denies any symptom of palpitations suggestive of tachyarrhythmias.      Relevant Medications   metoprolol  tartrate (LOPRESSOR ) 25 MG tablet   Return to clinic tentatively in 6 months.   Closely follow-up device checks and if significant A-fib burden will need anticoagulation. Will review echocardiogram and lab work results from PCPs office when available.    History of Present Illness:    Jay Hanson is a 73 y.o. male who is being seen today for establish care with general cardiology. He recently had a visit with electrophysiologist Lei Pump, MD on 07-16-2023.  Has been following up with Dr. Lawana Pray since his pacemaker implant in July 2020.  Complicated and extensive history of extensive amount of time to review his chart.  He has history of coronary artery disease s/p STEMI complicated by cardiac arrest underwent PCI in 2014 [cath report not available for my review at this time], subsequent stress test with nuclear imaging was 2017 [results not available for my review], last echocardiogram is from April 2020 at [facility] health reporting EF 55 to 60% with basal to mid inferolateral hypokinesis, diabetes mellitus, symptomatic junctional bradycardia and type I second-degree AV block associated with PACs and did not tolerate beta-blocker therapy and felt to be sinus node dysfunction on evaluation at [facility] in July 2020 s/p permanent pacemaker implant 10-04-2018 [last device check 07/16/2023 with EP follow-up], subclinical device identified atrial fibrillation [as per EP follow-up about 3 episodes of A-fib throughout the monitoring.  Without significant burden hence not on anticoagulation at this time]. Mentions had echocardiogram in the past 6 months to his PCPs office at [facility] internal medicine along with blood work, results of which are not available for my review  at this time and will request from his PCPs office.  EKG in the clinic today shows AV dual paced rhythm.  Pleasant gentleman here for the visit today by himself.  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  Mentions he does lift up to 100 pounds of weights without any limitation.  No active cardiac symptoms suggestive of pain, shortness of breath, orthopnea, pedal edema, palpitations, syncopal or near syncopal episodes.  Occasional lightheadedness when suddenly changing position after bending down to work for extended duration. Takes his medications consistently. No symptoms of blood in urine or stools.  Continues to chew tobacco.  Past Medical History:  Diagnosis Date   Acute abdomen 04/28/2021   Allergic rhinitis 04/28/2021   Allergies 06/22/2018   Bradycardia 10/04/2018   CAD (coronary artery disease) 10/04/2018   Cardiac pacemaker in situ 05/26/2019   Chest pain 12/12/2015   Chronic sinusitis 04/28/2021   Diabetes mellitus without complication (HCC)    Encounter for therapeutic drug level monitoring 04/28/2021   Essential hypertension 04/28/2021   Gastro-esophageal reflux disease without esophagitis 04/28/2021   H/O heart artery stent 05/26/2019   Heart block 06/22/2018   History of MI (myocardial infarction) 12/12/2015   HLD (hyperlipidemia) 10/04/2018   Hyperglycemia due to type 2 diabetes mellitus (HCC) 04/28/2021   Hypothyroidism  04/28/2021   Low back strain 04/28/2021   MI (myocardial infarction) (HCC)    Nausea 04/28/2021   Nicotine dependence 04/28/2021   Occlusion and stenosis of bilateral carotid arteries 04/28/2021   Old MI (myocardial infarction) 05/26/2019   Other vitamin B12 deficiency anemias 04/28/2021   Premature atrial beats 06/26/2018   Second degree AV block, Mobitz type II 05/26/2019   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   Insulin  Degludec (TRESIBA [place]) Inject 60 Units into the skin in the morning.   metoprolol  tartrate (LOPRESSOR ) 25 MG tablet Take 0.5 tablets (12.5 mg total) by mouth 2 (two) times daily.   [DISCONTINUED] metoprolol  tartrate (LOPRESSOR ) 25 MG tablet Take 25 mg by mouth in the morning, at noon, and at bedtime.     Allergies:   Azithromycin and Canagliflozin   Social History   Socioeconomic History   Marital status: Married    Spouse name: Not on file   Number of children: Not on file   Years of education: Not on file   Highest education level: Not on file  Occupational History   Not on file  Tobacco Use   Smoking status: Former   Smokeless tobacco: Never  Vaping Use   Vaping status: Never Used  Substance and Sexual Activity   Alcohol use: Never   Drug use: Never   Sexual activity: Not on file  Other Topics Concern   Not on file  Social History Narrative   ** Merged History Encounter **       Social Drivers of Health   Financial Resource Strain: Not on file  Food Insecurity: Not on file  Transportation Needs: Not on file  Physical Activity: Not on file  Stress: Not on file  Social Connections: Not on file     Family History: The patient's family history includes Heart disease in his brother. ROS:   Please see the history of present illness.    All 14 point review of systems negative except as described per history of present  illness.  EKGs/Labs/Other Studies Reviewed:    The following studies were reviewed today:   EKG:  EKG Interpretation Date/Time:  Friday August 10 2023 11:06:30 EDT Ventricular Rate:  60 PR Interval:  198 QRS Duration:  178 QT Interval:  456 QTC Calculation: 456 R Axis:   -87  Text Interpretation: AV dual-paced rhythm Abnormal ECG When compared with ECG of 16-Jul-2023 15:46, No significant change was found Confirmed by Bertha Broad reddy [phone]) on 08/10/2023 11:24:02 AM    Recent Labs: 01/28/2023: ALT 14; B Natriuretic Peptide 84.5; BUN 11; Creatinine, Ser 1.06; Hemoglobin 14.0; Platelets 223; Potassium 4.2; Sodium 136  Recent Lipid Panel No results found for: "CHOL", "TRIG", "HDL", "CHOLHDL", "VLDL", "LDLCALC", "LDLDIRECT"  Physical Exam:    VS:  BP 126/84 (BP Location: Right Arm, Patient Position: Sitting)   Pulse 60   Ht 5\' 8"  (1.727 m)  Wt 254 lb (115.2 kg)   SpO2 95%   BMI 38.62 kg/m     Wt Readings from Last 3 Encounters:  08/10/23 254 lb (115.2 kg)  07/16/23 251 lb (113.9 kg)  01/28/23 250 lb (113.4 kg)     GENERAL:  Well nourished, well developed in no acute distress NECK: No JVD; No carotid bruits CARDIAC: RRR, S1 and S2 present, no murmurs, no rubs, no gallops CHEST:  Clear to auscultation without rales, wheezing or rhonchi  Extremities: No pitting pedal edema. Pulses bilaterally symmetric with radial 2+ and dorsalis pedis 2+ NEUROLOGIC:  Alert and oriented x 3  Medication Adjustments/Labs and Tests Ordered: Current medicines are reviewed at length with the patient today.  Concerns regarding medicines are outlined above.  Orders Placed This Encounter  Procedures   EKG 12-Lead   Meds ordered this encounter  Medications   metoprolol  tartrate (LOPRESSOR ) 25 MG tablet    Sig: Take 0.5 tablets (12.5 mg total) by mouth 2 (two) times daily.    Dispense:  90 tablet    Refill:  3    Signed, Marvie Brevik reddy Iley Deignan, MD, MPH, [facility]. 08/10/2023 1:33 PM     [facility]

## 2023-08-10 NOTE — Patient Instructions (Signed)
 Medication Instructions:  Your physician has recommended you make the following change in your medication:   START: Metoprolol  tartrate 12.5 mg two times daily (After 1 month discontinue the medication)  *If you need a refill on your cardiac medications before your next appointment, please call your pharmacy*  Lab Work: None If you have labs (blood work) drawn today and your tests are completely normal, you will receive your results only by: MyChart Message (if you have MyChart) OR A paper copy in the mail If you have any lab test that is abnormal or we need to change your treatment, we will call you to review the results.  Testing/Procedures: None  Follow-Up: At [facility], you and your health needs are our priority.  As part of our continuing mission to provide you with exceptional heart care, our providers are all part of one team.  This team includes your primary Cardiologist (physician) and Advanced Practice Providers or APPs (Physician Assistants and Nurse Practitioners) who all work together to provide you with the care you need, when you need it.  Your next appointment:   6 month(s)  Provider:   Bertha Broad, MD    We recommend signing up for the patient portal called "MyChart".  Sign up information is provided on this After Visit Summary.  MyChart is used to connect with patients for Virtual Visits (Telemedicine).  Patients are able to view lab/test results, encounter notes, upcoming appointments, etc.  Non-urgent messages can be sent to your provider as well.   To learn more about what you can do with MyChart, go to [email or web address].   Other Instructions None

## 2023-08-10 NOTE — Assessment & Plan Note (Signed)
 Will review blood work from PCPs office once available. If no significant LFT abnormalities, will recommend starting Crestor 10 mg once daily.

## 2023-08-10 NOTE — Assessment & Plan Note (Signed)
 history of CAD s/p STEMI complicated by cardiac arrest underwent PCI in 2014 [cath report not available for my review at this time], subsequent stress test with nuclear imaging was 2017 [results not available for my review], last echocardiogram is from April 2020 at [facility] health reporting EF 55 to 60% with basal to mid inferolateral hypokinesis  Request results of echocardiogram done at PCPs office with patient in the last 6 months at [facility].  Asymptomatic CCS class I. Continue with Plavix  75 mg once daily.  He is concerned about metoprolol  tartrate causing sexual dysfunction. Discussed weaning it off. He did not have any recent MI, not in heart failure. Continue with metoprolol  tartrate 12.5 mg, reduce the dose to twice daily for the next 1 month and subsequently if doing well, discontinue.  Is currently not on a statin, unclear the reason. No recent lipid panel available. Will request these results from his PCPs office.  Given his history of CAD and MI, should be on statin. If no significant abnormalities on recent LFTs, will recommend starting Crestor 10 mg once daily.

## 2023-08-10 NOTE — Assessment & Plan Note (Signed)
 S/p dual-chamber pacemaker implant 10-04-2018. Last device check 07-16-2023 with appropriate functioning. Continues to follow-up with device clinic and Dr. Lawana Pray.

## 2023-08-22 ENCOUNTER — Ambulatory Visit (INDEPENDENT_AMBULATORY_CARE_PROVIDER_SITE_OTHER): Payer: PPO

## 2023-08-22 DIAGNOSIS — I441 Atrioventricular block, second degree: Secondary | ICD-10-CM | POA: Diagnosis not present

## 2023-08-22 LAB — CUP PACEART REMOTE DEVICE CHECK
Battery Remaining Longevity: 64 mo
Battery Remaining Percentage: 52 %
Battery Voltage: 2.99 V
Brady Statistic AP VP Percent: 92 %
Brady Statistic AP VS Percent: 1 %
Brady Statistic AS VP Percent: 7.9 %
Brady Statistic AS VS Percent: 1 %
Brady Statistic RA Percent Paced: 92 %
Brady Statistic RV Percent Paced: 99 %
Date Time Interrogation Session: 20250618071355
Implantable Lead Connection Status: 753985
Implantable Lead Connection Status: 753985
Implantable Lead Implant Date: 20200731
Implantable Lead Implant Date: 20200731
Implantable Lead Location: 753859
Implantable Lead Location: 753860
Implantable Pulse Generator Implant Date: 20200731
Lead Channel Impedance Value: 390 Ohm
Lead Channel Impedance Value: 730 Ohm
Lead Channel Pacing Threshold Amplitude: 0.5 V
Lead Channel Pacing Threshold Amplitude: 0.5 V
Lead Channel Pacing Threshold Pulse Width: 0.4 ms
Lead Channel Pacing Threshold Pulse Width: 0.5 ms
Lead Channel Sensing Intrinsic Amplitude: 10.8 mV
Lead Channel Sensing Intrinsic Amplitude: 3.9 mV
Lead Channel Setting Pacing Amplitude: 0.75 V
Lead Channel Setting Pacing Amplitude: 1.5 V
Lead Channel Setting Pacing Pulse Width: 0.5 ms
Lead Channel Setting Sensing Sensitivity: 2 mV
Pulse Gen Model: 2272
Pulse Gen Serial Number: 9152674

## 2023-08-23 ENCOUNTER — Ambulatory Visit: Payer: Self-pay | Admitting: Cardiology

## 2023-11-01 NOTE — Progress Notes (Signed)
 Remote pacemaker transmission.

## 2024-02-20 ENCOUNTER — Ambulatory Visit: Payer: PPO

## 2024-02-20 DIAGNOSIS — I441 Atrioventricular block, second degree: Secondary | ICD-10-CM

## 2024-02-21 LAB — CUP PACEART REMOTE DEVICE CHECK
Battery Remaining Longevity: 57 mo
Battery Remaining Percentage: 46 %
Battery Voltage: 2.98 V
Brady Statistic AP VP Percent: 94 %
Brady Statistic AP VS Percent: 1 %
Brady Statistic AS VP Percent: 5.9 %
Brady Statistic AS VS Percent: 1 %
Brady Statistic RA Percent Paced: 93 %
Brady Statistic RV Percent Paced: 99 %
Date Time Interrogation Session: 20251217033147
Implantable Lead Connection Status: 753985
Implantable Lead Connection Status: 753985
Implantable Lead Implant Date: 20200731
Implantable Lead Implant Date: 20200731
Implantable Lead Location: 753859
Implantable Lead Location: 753860
Implantable Pulse Generator Implant Date: 20200731
Lead Channel Impedance Value: 380 Ohm
Lead Channel Impedance Value: 660 Ohm
Lead Channel Pacing Threshold Amplitude: 0.5 V
Lead Channel Pacing Threshold Amplitude: 0.5 V
Lead Channel Pacing Threshold Pulse Width: 0.4 ms
Lead Channel Pacing Threshold Pulse Width: 0.5 ms
Lead Channel Sensing Intrinsic Amplitude: 12 mV
Lead Channel Sensing Intrinsic Amplitude: 3.5 mV
Lead Channel Setting Pacing Amplitude: 0.75 V
Lead Channel Setting Pacing Amplitude: 1.5 V
Lead Channel Setting Pacing Pulse Width: 0.5 ms
Lead Channel Setting Sensing Sensitivity: 2 mV
Pulse Gen Model: 2272
Pulse Gen Serial Number: 9152674

## 2024-02-21 NOTE — Progress Notes (Signed)
 Remote PPM Transmission

## 2024-03-08 ENCOUNTER — Ambulatory Visit: Payer: Self-pay | Admitting: Cardiology
